# Patient Record
Sex: Female | Born: 1935 | Race: White | Hispanic: No | Smoking: Former smoker
Health system: Southern US, Community
[De-identification: ages and names within clinical notes are randomized; demographics above are authoritative.]

## PROBLEM LIST (undated history)

## (undated) DIAGNOSIS — E7800 Pure hypercholesterolemia, unspecified: Secondary | ICD-10-CM

## (undated) DIAGNOSIS — L570 Actinic keratosis: Secondary | ICD-10-CM

## (undated) DIAGNOSIS — Z860100 Personal history of colon polyps, unspecified: Secondary | ICD-10-CM

## (undated) DIAGNOSIS — M1990 Unspecified osteoarthritis, unspecified site: Secondary | ICD-10-CM

## (undated) DIAGNOSIS — K5903 Drug induced constipation: Secondary | ICD-10-CM

## (undated) HISTORY — DX: Personal history of colonic polyps: Z86.010

## (undated) HISTORY — PX: FRACTURE SURGERY: SHX138

## (undated) HISTORY — DX: Personal history of colon polyps, unspecified: Z86.0100

## (undated) HISTORY — DX: Actinic keratosis: L57.0

---

## 1988-05-29 HISTORY — PX: OTHER SURGICAL HISTORY: SHX169

## 2004-05-04 ENCOUNTER — Ambulatory Visit: Payer: Self-pay | Admitting: Family Medicine

## 2006-05-31 ENCOUNTER — Ambulatory Visit: Payer: Self-pay | Admitting: Gastroenterology

## 2006-08-09 ENCOUNTER — Ambulatory Visit: Payer: Self-pay | Admitting: Gastroenterology

## 2007-10-16 ENCOUNTER — Other Ambulatory Visit: Payer: Self-pay

## 2007-10-16 ENCOUNTER — Ambulatory Visit: Payer: Self-pay | Admitting: Ophthalmology

## 2009-10-09 LAB — HM PAP SMEAR: HM Pap smear: NEGATIVE

## 2010-02-14 ENCOUNTER — Ambulatory Visit: Payer: Self-pay | Admitting: Unknown Physician Specialty

## 2010-08-30 ENCOUNTER — Emergency Department: Payer: Self-pay | Admitting: Emergency Medicine

## 2010-10-12 ENCOUNTER — Ambulatory Visit: Payer: Self-pay | Admitting: Family Medicine

## 2013-03-26 LAB — HM DEXA SCAN

## 2013-05-12 ENCOUNTER — Ambulatory Visit: Payer: Self-pay | Admitting: Unknown Physician Specialty

## 2013-06-17 ENCOUNTER — Ambulatory Visit: Payer: Self-pay | Admitting: Unknown Physician Specialty

## 2013-06-17 LAB — HM COLONOSCOPY

## 2014-04-15 LAB — LIPID PANEL
CHOLESTEROL: 217 mg/dL — AB (ref 0–200)
HDL: 74 mg/dL — AB (ref 35–70)
LDL Cholesterol: 131 mg/dL
Triglycerides: 58 mg/dL (ref 40–160)

## 2014-04-15 LAB — CBC AND DIFFERENTIAL
HEMATOCRIT: 40 % (ref 36–46)
HEMOGLOBIN: 13.3 g/dL (ref 12.0–16.0)
Platelets: 171 10*3/uL (ref 150–399)
WBC: 5.8 10^3/mL

## 2014-05-14 LAB — HM MAMMOGRAPHY: HM MAMMO: NEGATIVE

## 2015-05-04 ENCOUNTER — Ambulatory Visit (INDEPENDENT_AMBULATORY_CARE_PROVIDER_SITE_OTHER): Payer: Medicare Other | Admitting: Family Medicine

## 2015-05-04 ENCOUNTER — Encounter: Payer: Self-pay | Admitting: Family Medicine

## 2015-05-04 VITALS — BP 120/70 | HR 74 | Temp 98.3°F | Resp 16 | Ht 69.18 in | Wt 145.0 lb

## 2015-05-04 DIAGNOSIS — E785 Hyperlipidemia, unspecified: Secondary | ICD-10-CM | POA: Insufficient documentation

## 2015-05-04 DIAGNOSIS — E559 Vitamin D deficiency, unspecified: Secondary | ICD-10-CM

## 2015-05-04 DIAGNOSIS — R5383 Other fatigue: Secondary | ICD-10-CM | POA: Insufficient documentation

## 2015-05-04 DIAGNOSIS — Z23 Encounter for immunization: Secondary | ICD-10-CM

## 2015-05-04 DIAGNOSIS — Z86010 Personal history of colonic polyps: Secondary | ICD-10-CM | POA: Insufficient documentation

## 2015-05-04 DIAGNOSIS — R198 Other specified symptoms and signs involving the digestive system and abdomen: Secondary | ICD-10-CM | POA: Insufficient documentation

## 2015-05-04 DIAGNOSIS — M8580 Other specified disorders of bone density and structure, unspecified site: Secondary | ICD-10-CM | POA: Insufficient documentation

## 2015-05-04 DIAGNOSIS — M1990 Unspecified osteoarthritis, unspecified site: Secondary | ICD-10-CM | POA: Insufficient documentation

## 2015-05-04 DIAGNOSIS — D509 Iron deficiency anemia, unspecified: Secondary | ICD-10-CM | POA: Insufficient documentation

## 2015-05-04 DIAGNOSIS — Z0000 Encounter for general adult medical examination without abnormal findings: Secondary | ICD-10-CM

## 2015-05-04 DIAGNOSIS — K219 Gastro-esophageal reflux disease without esophagitis: Secondary | ICD-10-CM | POA: Insufficient documentation

## 2015-05-04 NOTE — Progress Notes (Signed)
Patient: Amanda Meyer, Female    DOB: Jun 06, 1932, 81 y.o.   MRN: [id number] Visit Date: 05/04/2015  Today's Provider: Lelon Huh, MD   Chief Complaint  Patient presents with  . Annual Exam  . Hyperlipidemia  . Gastroesophageal Reflux   Subjective:    Annual physical  Amanda Meyer is a 81 y.o. female. She feels well. She reports exercising yes. She reports she is sleeping well.  -----------------------------------------------------------     Lipid/Cholesterol, Follow-up:   Last seen for this 04/15/2014  .  On low fat diet. Did not tolerate pravastatin in the past.  . Last Lipid Panel:    Component Value Date/Time   CHOL 217* 04/15/2014   TRIG 58 04/15/2014   HDL 74* 04/15/2014   LDLCALC 131 04/15/2014      She reports good compliance with treatment. (low fat diet)  Weight trend: slightly dropping weight Prior visit with dietician: no Current diet: in general, a "healthy" diet   Current exercise: no regular exercise  Wt Readings from Last 3 Encounters:  05/04/15 145 lb (65.772 kg)  04/15/14 145 lb (65.772 kg)    -------------------------------------------------------------------   Review of Systems  Constitutional: Positive for fatigue.       Occasionally   HENT: Positive for congestion and postnasal drip.        Nasal congestion since June, on and off  Eyes: Negative.   Respiratory: Negative.   Cardiovascular: Negative.   Gastrointestinal: Negative.   Endocrine: Negative.   Genitourinary: Negative.   Musculoskeletal: Positive for back pain and arthralgias.  Skin: Negative.   Allergic/Immunologic: Negative.   Neurological: Negative.   Hematological: Negative.   Psychiatric/Behavioral: Negative.     Social History   Social History  . Marital Status: Widowed    Spouse Name: N/A  . Number of Children: 4  . Years of Education: N/A   Occupational History  . 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 Smoking status: Former Smoker -- 0.75 packs/day for 8 years    Types: Cigarettes    Quit date: 05/29/1960  . Smokeless tobacco: Not on file  . Alcohol Use: No  . Drug Use: No  . Sexual Activity: Not on file   Other Topics Concern  . Not on file   Social History Narrative    Patient Active Problem List   Diagnosis Date Noted  . Altered bowel function 05/04/2015  . GERD (gastroesophageal reflux disease) 05/04/2015  . Fatigue 05/04/2015  . History of colonic polyps 05/04/2015  . HLD (hyperlipidemia) 05/04/2015  . Iron deficiency anemia 05/04/2015  . Osteoarthritis 05/04/2015  . Osteopenia 05/04/2015  . Vitamin D deficiency 05/04/2015    Past Surgical History  Procedure Laterality Date  . Right wrist surgery  1990    broken wrist repair    Her family history includes Bladder Cancer in her brother; Colon cancer in her father. There is no history of Heart disease, Stroke, Diabetes, or Peripheral vascular disease.    Previous Medications   ASPIRIN 81 MG TABLET    Take 1 tablet by mouth daily.   CALCIUM CARBONATE (OSCAL) 1500 (600 CA) MG TABS TABLET    Take 1 tablet by mouth daily.   CHOLECALCIFEROL (VITAMIN D) 1000 UNITS TABLET    Take 1 tablet by mouth daily.   MULTIPLE VITAMINS-MINERALS (MULTIVITAMIN ADULT PO)    Take 1 tablet by mouth daily.   OMEPRAZOLE (  PRILOSEC OTC) 20 MG TABLET    Take 1-2 tablets by mouth daily.   RALOXIFENE (EVISTA) 60 MG TABLET    Take 1 tablet by mouth daily.    Patient Care Team: Birdie Sons, MD as PCP - General (Family Medicine) Manya Silvas, MD (Gastroenterology)     Objective:   Vitals: BP 120/70 mmHg  Pulse 74  Temp(Src) 98.3 F (36.8 C) (Oral)  Resp 16  Ht 5' 9.18" (1.757 m)  Wt 145 lb (65.772 kg)  BMI 21.31 kg/m2  SpO2 97%  Physical Exam   General Appearance:    Alert, cooperative, no distress, appears stated age  Head:    Normocephalic, without obvious abnormality, atraumatic  Eyes:    PERRL,  conjunctiva/corneas clear, EOM's intact, fundi    benign, both eyes  Ears:    Normal TM's and external ear canals, both ears  Nose:   Nares normal, septum midline, mucosa normal, mild clear drainage     Throat:   Lips, mucosa, and tongue normal; teeth and gums normal  Neck:   Supple, symmetrical, trachea midline, no adenopathy;    thyroid:  no enlargement/tenderness/nodules; no carotid   bruit or JVD  Back:     Symmetric, no curvature, ROM normal, no CVA tenderness  Lungs:     Clear to auscultation bilaterally, respirations unlabored  Chest Wall:    No tenderness or deformity   Heart:    Regular rate and rhythm, S1 and S2 normal, no murmur, rub   or gallop  Breast Exam:    deferred  Abdomen:     Soft, non-tender, bowel sounds active all four quadrants,    no masses, no organomegaly  Pelvic:    deferred  Extremities:   Extremities normal, atraumatic, no cyanosis or edema  Pulses:   2+ and symmetric all extremities  Skin:   Skin color, texture, turgor normal, no rashes or lesions  Lymph nodes:   Cervical, supraclavicular, and axillary nodes normal  Neurologic:   CNII-XII intact, normal strength, sensation and reflexes    throughout    Activities of Daily Living In your present state of health, do you have any difficulty performing the following activities: 05/04/2015  Hearing? N  Vision? Y  Difficulty concentrating or making decisions? N  Walking or climbing stairs? N  Dressing or bathing? N  Doing errands, shopping? N    Fall Risk Assessment Fall Risk  05/04/2015  Falls in the past year? No     Depression Screen PHQ 2/9 Scores 05/04/2015  PHQ - 2 Score 0  PHQ- 9 Score 1    Cognitive Testing - 6-CIT  Correct? Score   What year is it? yes 0 0 or 4  What month is it? yes 0 0 or 3  Memorize:    [place], [place],  [birth year],  [place],      What time is it? (within 1 hour) yes 0 0 or 3  Count backwards from 20 yes 0 0, 2, or 4  Name the months of the year yes 0 0, 2, or 4   Repeat name & address above yes 0 0, 2, 4, 6, 8, or 10       TOTAL SCORE  0/28   Interpretation:  Normal  Normal (0-7) Abnormal (8-28)   Audit-C Alcohol Use Screening  Question Answer Points  How often do you have alcoholic drink? never 0  On days you do drink alcohol, how many drinks do you  typically consume? n/a 0  How oftey will you drink 6 or more in a total? never 0  Total Score:  0   A score of 3 or more in women, and 4 or more in men indicates increased risk for alcohol abuse, EXCEPT if all of the points are from question 1.      Assessment & Plan:     Annual Wellness Visit  Reviewed patient's Family Medical History Reviewed and updated list of patient's medical providers Assessment of cognitive impairment was done Assessed patient's functional ability Established a written schedule for health screening [place] Completed and Reviewed  Exercise Activities and Dietary recommendations Goals    None      Immunization History  Administered Date(s) Administered  . Influenza, High Dose Seasonal PF 05/04/2015  . Pneumococcal Conjugate-13 04/15/2014  . Pneumococcal Polysaccharide-23 05/04/2015  . Tdap 10/12/2010  . Zoster 07/28/2008      Discussed health benefits of physical activity, and encouraged her to engage in regular exercise appropriate for her age and condition.    --------------------------------------------------------------------------------  1. Annual physical exam Doing well. Given info for scheduled mammogram at [facility]. Discussed discontinuation of routine mammograms.  - Basic metabolic panel  2. HLD (hyperlipidemia) Intolerant to pravastatin. See how well diet is controlling. Continue 81mg  daily ASA - Lipid panel  3. Iron deficiency anemia  - CBC  4. Vitamin D deficiency  - VITAMIN D 25 Hydroxy (Vit-D Deficiency, Fractures)  5. Need for influenza vaccination  - Flu vaccine HIGH DOSE PF  6. Need for  pneumococcal vaccination

## 2015-05-04 NOTE — Patient Instructions (Signed)
Please call the [facility] ([phone]) to schedule a routine screening mammogram.  

## 2015-05-05 ENCOUNTER — Telehealth: Payer: Self-pay | Admitting: Family Medicine

## 2015-05-05 LAB — LIPID PANEL
CHOLESTEROL TOTAL: 199 mg/dL (ref 100–199)
Chol/HDL Ratio: 2.7 ratio units (ref 0.0–4.4)
HDL: 73 mg/dL (ref >39)
LDL CALC: 110 mg/dL — AB (ref 0–99)
Triglycerides: 79 mg/dL (ref 0–149)
VLDL CHOLESTEROL CAL: 16 mg/dL (ref 5–40)

## 2015-05-05 LAB — CBC
HEMATOCRIT: 37.9 % (ref 34.0–46.6)
Hemoglobin: 12.7 g/dL (ref 11.1–15.9)
MCH: 31 pg (ref 26.6–33.0)
MCHC: 33.5 g/dL (ref 31.5–35.7)
MCV: 92 fL (ref 79–97)
PLATELETS: 189 10*3/uL (ref 150–379)
RBC: 4.1 x10E6/uL (ref 3.77–5.28)
RDW: 13.6 % (ref 12.3–15.4)
WBC: 6 10*3/uL (ref 3.4–10.8)

## 2015-05-05 LAB — VITAMIN D 25 HYDROXY (VIT D DEFICIENCY, FRACTURES): Vit D, 25-Hydroxy: 43.6 ng/mL (ref 30.0–100.0)

## 2015-05-05 LAB — BASIC METABOLIC PANEL
BUN / CREAT RATIO: 17 (ref 11–26)
BUN: 16 mg/dL (ref 8–27)
CO2: 25 mmol/L (ref 18–29)
CREATININE: 0.93 mg/dL (ref 0.57–1.00)
Calcium: 9.2 mg/dL (ref 8.7–10.3)
Chloride: 103 mmol/L (ref 97–106)
GFR calc Af Amer: 67 mL/min/{1.73_m2} (ref >59)
GFR, EST NON AFRICAN AMERICAN: 58 mL/min/{1.73_m2} — AB (ref >59)
GLUCOSE: 92 mg/dL (ref 65–99)
POTASSIUM: 4.6 mmol/L (ref 3.5–5.2)
SODIUM: 143 mmol/L (ref 136–144)

## 2015-05-05 NOTE — Telephone Encounter (Signed)
-----   Message from April Loleta Chance, [place] sent at 05/05/2015  9:33 AM EST ----- Regarding: Labs Patient wanted to know if you had checked her iron levels? I didn't see labs for that in her results. If not pt wanted to know if they could be added. She had recently donated blood and she said they rejected her blood due to her iron levels being low.

## 2015-05-05 NOTE — Telephone Encounter (Signed)
Patient will be out of town today and wanted [place] to call her tomorrow 05/06/2015.

## 2015-05-05 NOTE — Telephone Encounter (Signed)
We checked her red blood cell count and hemoglobin which were normal. If she were iron deficient those would be low.

## 2015-05-06 NOTE — Telephone Encounter (Signed)
Left message to call back  

## 2015-05-07 NOTE — Telephone Encounter (Signed)
Advised patient of results.  

## 2015-05-27 ENCOUNTER — Other Ambulatory Visit: Payer: Self-pay | Admitting: Family Medicine

## 2015-06-22 ENCOUNTER — Ambulatory Visit (INDEPENDENT_AMBULATORY_CARE_PROVIDER_SITE_OTHER): Payer: Medicare Other | Admitting: Family Medicine

## 2015-06-22 ENCOUNTER — Encounter: Payer: Self-pay | Admitting: Family Medicine

## 2015-06-22 VITALS — BP 130/78 | HR 72 | Temp 98.1°F | Resp 16 | Wt 144.0 lb

## 2015-06-22 DIAGNOSIS — D229 Melanocytic nevi, unspecified: Secondary | ICD-10-CM

## 2015-06-22 NOTE — Progress Notes (Signed)
Patient ID: Amanda Meyer, female   DOB: Apr 16, 1933, 82 y.o.   MRN: [id number]       Patient: Amanda Meyer Female    DOB: 1932-07-03   82 y.o.   MRN: [id number] Visit Date: 06/22/2015  Today's Provider: Lelon Huh, MD   Chief Complaint  Patient presents with  . spot on leg    X 2-3 weeks.    Subjective:    HPI Spot on left leg Patient reports that she noticed a brown mole on her lower left leg that seems to be alarming. Patient denies any itching, change in size, or drainage. Patient wants to be sure that it is not cancerous.      Allergies  Allergen Reactions  . Pravastatin Sodium     Fatigue  . Sulfa Antibiotics Rash   Previous Medications   ASPIRIN 81 MG TABLET    Take 1 tablet by mouth daily.   CALCIUM CARBONATE (OSCAL) 1500 (600 CA) MG TABS TABLET    Take 1 tablet by mouth daily.   CHOLECALCIFEROL (VITAMIN D) 1000 UNITS TABLET    Take 1 tablet by mouth daily.   MULTIPLE VITAMINS-MINERALS (MULTIVITAMIN ADULT PO)    Take 1 tablet by mouth daily.   OMEPRAZOLE (PRILOSEC OTC) 20 MG TABLET    Take 1-2 tablets by mouth daily.   OMEPRAZOLE (PRILOSEC) 20 MG CAPSULE    TAKE 1 TO 2 CAPSULES BY MOUTH DAILY (INSURANCE ONLY PAYS FOR 30 CAPSULES A MONTH)   RALOXIFENE (EVISTA) 60 MG TABLET    Take 1 tablet by mouth daily.    Review of Systems  Constitutional: Negative.   Skin: Negative.     Social History  Substance Use Topics  . Smoking status: Former Smoker -- 0.75 packs/day for 8 years    Types: Cigarettes    Quit date: 05/29/1960  . Smokeless tobacco: Not on file  . Alcohol Use: No   Objective:   BP 130/78 mmHg  Pulse 72  Temp(Src) 98.1 F (36.7 C)  Resp 16  Wt 144 lb (65.318 kg)  Physical Exam  General appearance: alert, well developed, well nourished, cooperative and in no distress Head: Normocephalic, without obvious abnormality, atraumatic Lungs: Respirations even and unlabored Extremities: No gross deformities Skin: About 61mm pigmented macule left lower  leg with irregular borders and pigmentation.  Neurologic: Mental status: Alert, oriented to person, place, and time, thought content appropriate.      Assessment & Plan:      1. Nevus  - Ambulatory referral to Dermatology  She states her insurance is requiring her to change to [facility]. She will be due for refills in February and will make reminder to send all new prescriptions the first of February.       Lelon Huh, MD  [facility]

## 2015-06-23 ENCOUNTER — Other Ambulatory Visit: Payer: Self-pay | Admitting: Family Medicine

## 2015-06-28 ENCOUNTER — Telehealth: Payer: Self-pay | Admitting: Family Medicine

## 2015-06-28 MED ORDER — RALOXIFENE HCL 60 MG PO TABS: 60.0000 mg | ORAL_TABLET | Freq: Every day | ORAL | Status: DC

## 2015-06-28 MED ORDER — OMEPRAZOLE 20 MG PO CPDR: 20.0000 mg | DELAYED_RELEASE_CAPSULE | Freq: Every day | ORAL | Status: AC

## 2015-06-28 NOTE — Telephone Encounter (Signed)
-----   Message from Birdie Sons, MD sent at 06/22/2015  4:49 PM EST ----- Regarding: FW: rf all meds [facility] first of february   ----- Message -----    From: Birdie Sons, MD    Sent: 06/22/2015   4:48 PM      To: Birdie Sons, MD Subject: rf all meds [facility] first of feb#

## 2016-04-26 ENCOUNTER — Ambulatory Visit
Admission: RE | Admit: 2016-04-26 | Discharge: 2016-04-26 | Disposition: A | Discharge status: Home / Self Care | Payer: Medicare Other | Source: Ambulatory Visit | Attending: Family Medicine | Admitting: Family Medicine

## 2016-04-26 ENCOUNTER — Encounter: Payer: Self-pay | Admitting: Family Medicine

## 2016-04-26 ENCOUNTER — Ambulatory Visit (INDEPENDENT_AMBULATORY_CARE_PROVIDER_SITE_OTHER): Payer: Medicare Other | Admitting: Family Medicine

## 2016-04-26 VITALS — BP 120/66 | HR 82 | Temp 97.7°F | Resp 16 | Wt 144.0 lb

## 2016-04-26 DIAGNOSIS — M25551 Pain in right hip: Secondary | ICD-10-CM

## 2016-04-26 DIAGNOSIS — I700 Atherosclerosis of aorta: Secondary | ICD-10-CM | POA: Insufficient documentation

## 2016-04-26 DIAGNOSIS — M1611 Unilateral primary osteoarthritis, right hip: Secondary | ICD-10-CM | POA: Insufficient documentation

## 2016-04-26 DIAGNOSIS — M5136 Other intervertebral disc degeneration, lumbar region: Secondary | ICD-10-CM | POA: Insufficient documentation

## 2016-04-26 MED ORDER — METHYLPREDNISOLONE ACETATE 80 MG/ML IJ SUSP
80.0000 mg | Freq: Once | INTRAMUSCULAR | Status: AC
  Administered 2016-04-26: 80 mg via INTRAMUSCULAR

## 2016-04-26 NOTE — Patient Instructions (Signed)
Go to the [facility] on [address] for back and hip Xray 

## 2016-04-26 NOTE — Progress Notes (Signed)
       Patient: Amanda Meyer Female    DOB: 30-Nov-1932   82 y.o.   MRN: [id number] Visit Date: 04/26/2016  Today's Provider: Lelon Huh, MD   Chief Complaint  Patient presents with  . Hip Pain   Subjective:    Hip Pain   Incident onset: chronic; 1 year or more. There was no injury mechanism. The pain is present in the right hip. The quality of the pain is described as burning (occasionally sharp pain that sometimes radiates into right leg). The pain has been worsening since onset. She has tried nothing for the symptoms.  . Pain migrates around right side of hip and leg, but is present every day. Gets worse when she bears weight on right leg. Sometimes keeps her up at night.      Allergies  Allergen Reactions  . Pravastatin Sodium     Fatigue  . Sulfa Antibiotics Rash     Current Outpatient Prescriptions:  .  aspirin 81 MG tablet, Take 1 tablet by mouth daily., Disp: , Rfl:  .  calcium carbonate (OSCAL) 1500 (600 CA) MG TABS tablet, Take 1 tablet by mouth daily., Disp: , Rfl:  .  cholecalciferol (VITAMIN D) 1000 UNITS tablet, Take 1 tablet by mouth daily., Disp: , Rfl:  .  Multiple Vitamins-Minerals (MULTIVITAMIN ADULT PO), Take 1 tablet by mouth daily., Disp: , Rfl:  .  omeprazole (PRILOSEC OTC) 20 MG tablet, Take 1-2 tablets by mouth daily., Disp: , Rfl:  .  omeprazole (PRILOSEC) 20 MG capsule, Take 1 capsule (20 mg total) by mouth daily., Disp: 60 capsule, Rfl: 12 .  raloxifene (EVISTA) 60 MG tablet, Take 1 tablet (60 mg total) by mouth daily., Disp: 30 tablet, Rfl: 12  Review of Systems  Constitutional: Negative for chills and fever.  Musculoskeletal: Positive for gait problem. Negative for back pain, joint swelling, neck pain and neck stiffness.    Social History  Substance Use Topics  . Smoking status: Former Smoker    Packs/day: 0.75    Years: 8.00    Types: Cigarettes    Quit date: 05/29/1960  . Smokeless tobacco: Never Used  . Alcohol use No   Objective:     BP 120/66 (BP Location: Left Arm, Patient Position: Sitting, Cuff Size: Normal)   Pulse 82   Temp 97.7 F (36.5 C) (Oral)   Resp 16   Wt 144 lb (65.3 kg)   SpO2 96%   BMI 21.16 kg/m   Physical Exam  .General appearance: alert, well developed, well nourished, cooperative and in no distress Head: Normocephalic, without obvious abnormality, atraumatic Respiratory: Respirations even and unlabored, normal respiratory rate Extremities: Pain at limits or right hip flexion, internal and external rotation. Tender along lateral aspect of upper thigh and greater trochanter.  Neuro: Normal strength and tone both LEs. .      Assessment & Plan:     1. Right hip pain  - DG HIP UNILAT W OR W/O PELVIS 2-3 VIEWS RIGHT; Future - DG Lumbar Spine Complete; Future - methylPREDNISolone acetate (DEPO-MEDROL) injection 80 mg; Inject 1 mL (80 mg total) into the muscle once.       Lelon Huh, MD  [facility]

## 2016-04-27 ENCOUNTER — Telehealth: Payer: Self-pay

## 2016-04-27 NOTE — Telephone Encounter (Signed)
Patient has been advised. KW 

## 2016-04-27 NOTE — Telephone Encounter (Signed)
-----   Message from Birdie Sons, MD sent at 04/27/2016  8:02 AM EST ----- xrays show some arthritis in spine and hip, but otherwise normal. Pain in her hip is likely from bursitis. Give the cortisone a few days to take effect, if not much better over the weekend that call for referral to orthopedist.

## 2016-06-09 ENCOUNTER — Ambulatory Visit (INDEPENDENT_AMBULATORY_CARE_PROVIDER_SITE_OTHER): Payer: Medicare Other | Admitting: Family Medicine

## 2016-06-09 ENCOUNTER — Encounter: Payer: Self-pay | Admitting: Family Medicine

## 2016-06-09 VITALS — BP 132/78 | HR 71 | Temp 97.7°F | Resp 16 | Wt 140.0 lb

## 2016-06-09 DIAGNOSIS — M25551 Pain in right hip: Secondary | ICD-10-CM

## 2016-06-09 MED ORDER — MELOXICAM 15 MG PO TABS: 15.0000 mg | ORAL_TABLET | Freq: Every day | ORAL | 0 refills | Status: DC

## 2016-06-09 NOTE — Progress Notes (Signed)
Patient: Amanda Meyer Female    DOB: March 19, 1933   83 y.o.   MRN: [id number] Visit Date: 06/09/2016  Today's Provider: Lelon Huh, MD   Chief Complaint  Patient presents with  . Hip Pain   Subjective:    HPI Hip Pain:  Patient was last seen on 04/26/2016 for right hip pain and was given Depo Medrol IM injection. Xray was obtained showing arthritis in spine and hip. Pain was likely from bursitis. Today patient that pain improved somewhat after steroid injection, but never satisfactorily resolved. Pain is located in her right hip and goes down her right leg.   No results found. EXAM: 04/26/2016 LUMBAR SPINE - COMPLETE 4+ VIEW   COMPARISON:  Lumbar spine films of 10/12/2010  FINDINGS: On the frontal view, there is slightly more of a curvature of the lumbar spine convex to the left by 13 degrees than was present previously. There is little change in degenerative disc disease primarily at L2-3 and L3-4 levels where there is loss of disc space and sclerosis with spurring present. No compression deformity is seen. Moderate abdominal aortic atherosclerosis is noted. The SI joints are corticated.  IMPRESSION: 1. Slightly more curvature of the lumbar spine convex to the left measuring 13 degrees. 2. Little change in degenerative disc disease primarily at L2-3 and L3-4 levels. No compression deformity. 3. Moderate abdominal aortic atherosclerosis.   EXAM: 04/26/2016 DG HIP (WITH OR WITHOUT PELVIS) 2-3V RIGHT  COMPARISON:  None.  FINDINGS: For age there is only mild degenerative joint disease the right hip with slightly more loss of joint space on the right than is seen on the left. The pelvic rami are intact. The SI joints appear corticated. There is degenerative change noted at L4-5.  IMPRESSION: Mild degenerative joint disease of the right hip for age. No acute abnormality.    Allergies  Allergen Reactions  . Pravastatin Sodium     Fatigue  . Sulfa  Antibiotics Rash     Current Outpatient Prescriptions:  .  aspirin 81 MG tablet, Take 1 tablet by mouth daily., Disp: , Rfl:  .  calcium carbonate (OSCAL) 1500 (600 CA) MG TABS tablet, Take 1 tablet by mouth daily., Disp: , Rfl:  .  cholecalciferol (VITAMIN D) 1000 UNITS tablet, Take 1 tablet by mouth daily., Disp: , Rfl:  .  MAGNESIUM PO, Take 1 tablet by mouth 2 (two) times daily., Disp: , Rfl:  .  Multiple Vitamins-Minerals (MULTIVITAMIN ADULT PO), Take 1 tablet by mouth daily., Disp: , Rfl:  .  omeprazole (PRILOSEC) 20 MG capsule, Take 1 capsule (20 mg total) by mouth daily., Disp: 60 capsule, Rfl: 12 .  Probiotic Product (PEARLS IC PO), Take 1 tablet by mouth daily., Disp: , Rfl:  .  raloxifene (EVISTA) 60 MG tablet, Take 1 tablet (60 mg total) by mouth daily., Disp: 30 tablet, Rfl: 12  Review of Systems  Constitutional: Negative for appetite change, chills, fatigue and fever.  Respiratory: Negative for chest tightness and shortness of breath.   Cardiovascular: Negative for chest pain and palpitations.  Gastrointestinal: Negative for abdominal pain, nausea and vomiting.  Musculoskeletal: Positive for arthralgias (right hip) and myalgias (right leg).  Neurological: Negative for dizziness and weakness.    Social History  Substance Use Topics  . Smoking status: Former Smoker    Packs/day: 0.75    Years: 8.00    Types: Cigarettes    Quit date: 05/29/1960  . Smokeless tobacco: Never Used  .  Alcohol use No   Objective:   BP 132/78 (BP Location: Left Arm, Patient Position: Sitting, Cuff Size: Normal)   Pulse 71   Temp 97.7 F (36.5 C) (Oral)   Resp 16   Wt 140 lb (63.5 kg)   SpO2 97% Comment: room air  BMI 20.57 kg/m   Physical Exam  .General appearance: alert, well developed, well nourished, cooperative and in no distress Head: Normocephalic, without obvious abnormality, atraumatic Respiratory: Respirations even and unlabored, normal respiratory rate Extremities: Pain at  limits or right hip flexion, internal and external rotation. Tender along lateral aspect of upper thigh and greater trochanter.  Neuro: Normal strength and tone both LEs. .     Assessment & Plan:     1. Right hip pain No significant improvement after steroid injection in November. Discussed heat and ice therapy. Discussed orthopedic referral versus trail of NSAID. Will try meloxicam intermittently and call if not helping after a week.  - meloxicam (MOBIC) 15 MG tablet; Take 1 tablet (15 mg total) by mouth daily.  Dispense: 30 tablet; Refill: 0       Lelon Huh, MD  [facility]

## 2016-06-09 NOTE — Patient Instructions (Signed)
   Call for referral to orthopedist if not doing much better within a week of starting meloxicam

## 2016-06-25 ENCOUNTER — Other Ambulatory Visit: Payer: Self-pay | Admitting: Family Medicine

## 2016-06-26 ENCOUNTER — Encounter: Payer: Self-pay | Admitting: Family Medicine

## 2016-06-26 ENCOUNTER — Ambulatory Visit (INDEPENDENT_AMBULATORY_CARE_PROVIDER_SITE_OTHER): Payer: Medicare Other | Admitting: Family Medicine

## 2016-06-26 VITALS — BP 102/60 | HR 80 | Temp 97.6°F | Resp 16 | Wt 142.0 lb

## 2016-06-26 DIAGNOSIS — M25551 Pain in right hip: Secondary | ICD-10-CM

## 2016-06-26 NOTE — Progress Notes (Signed)
Patient: Amanda Meyer Female    DOB: 1932-06-20   83 y.o.   MRN: [id number] Visit Date: 06/26/2016  Today's Provider: Lelon Huh, MD   Chief Complaint  Patient presents with  . Hip Pain   Subjective:    HPI Hip Pain:  Patient was last seen for right hip pain on 06/09/2016. Patient was was counseled to try ice and heat therapy. Patient was also prescribed Meloxicam. States that pain in leg had resolved after starting meloxicam .  However, state that last week she was walking across aparking lot and felt a sudden severe sharp pain in her right hip. Patient says this sudden sharp pain has now occurred intermitently when ever she steps a certain way. Has had no trouble with balance or falling. Not taken any medications.     Allergies  Allergen Reactions  . Pravastatin Sodium     Fatigue  . Sulfa Antibiotics Rash     Current Outpatient Prescriptions:  .  aspirin 81 MG tablet, Take 1 tablet by mouth daily., Disp: , Rfl:  .  calcium carbonate (OSCAL) 1500 (600 CA) MG TABS tablet, Take 1 tablet by mouth daily., Disp: , Rfl:  .  cholecalciferol (VITAMIN D) 1000 UNITS tablet, Take 1 tablet by mouth daily., Disp: , Rfl:  .  MAGNESIUM PO, Take 1 tablet by mouth 2 (two) times daily., Disp: , Rfl:  .  meloxicam (MOBIC) 15 MG tablet, Take 1 tablet (15 mg total) by mouth daily., Disp: 30 tablet, Rfl: 0 .  Multiple Vitamins-Minerals (MULTIVITAMIN ADULT PO), Take 1 tablet by mouth daily., Disp: , Rfl:  .  omeprazole (PRILOSEC) 20 MG capsule, Take 1 capsule (20 mg total) by mouth daily., Disp: 60 capsule, Rfl: 12 .  Probiotic Product (PEARLS IC PO), Take 1 tablet by mouth daily., Disp: , Rfl:  .  raloxifene (EVISTA) 60 MG tablet, TAKE ONE TABLET EVERY DAY, Disp: 30 tablet, Rfl: 5  Review of Systems  Constitutional: Negative for appetite change, chills, fatigue and fever.  Respiratory: Negative for chest tightness and shortness of breath.   Cardiovascular: Negative for chest pain and  palpitations.  Gastrointestinal: Negative for abdominal pain, nausea and vomiting.  Musculoskeletal: Positive for arthralgias (right hip and leg).  Neurological: Negative for dizziness and weakness.    Social History  Substance Use Topics  . Smoking status: Former Smoker    Packs/day: 0.75    Years: 8.00    Types: Cigarettes    Quit date: 05/29/1960  . Smokeless tobacco: Never Used  . Alcohol use No   Objective:   BP 102/60 (BP Location: Left Arm, Patient Position: Sitting, Cuff Size: Normal)   Pulse 80   Temp 97.6 F (36.4 C) (Oral)   Resp 16   Wt 142 lb (64.4 kg)   BMI 20.86 kg/m   Physical Exam   General Appearance:    Alert, cooperative, no distress  Eyes:    PERRL, conjunctiva/corneas clear, EOM's intact       Lungs:     Clear to auscultation bilaterally, respirations unlabored  Heart:    Regular rate and rhythm  Neurologic:   Awake, alert, oriented x 3. No apparent focal neurological           defect.   MS:  No tenderness of hip. Slight pain with external rotation. Normal gait and balance. No gross deformities.        Assessment & Plan:     1. Right hip pain  New pain sudden onset while walking.  - DG HIP UNILAT W OR W/O PELVIS 2-3 VIEWS RIGHT; Future  The entirety of the information documented in the History of Present Illness, Review of Systems and Physical Exam were personally obtained by me. Portions of this information were initially documented by Meyer Cory, CMA and reviewed by me for thoroughness and accuracy.        Lelon Huh, MD  [facility]

## 2016-06-26 NOTE — Patient Instructions (Signed)
Go to the [facility] on [address] for right hip Xray  

## 2016-06-27 ENCOUNTER — Ambulatory Visit
Admission: RE | Admit: 2016-06-27 | Discharge: 2016-06-27 | Disposition: A | Discharge status: Home / Self Care | Payer: Medicare Other | Source: Ambulatory Visit | Attending: Family Medicine | Admitting: Family Medicine

## 2016-06-27 DIAGNOSIS — M25551 Pain in right hip: Secondary | ICD-10-CM | POA: Insufficient documentation

## 2016-07-14 ENCOUNTER — Other Ambulatory Visit: Payer: Self-pay

## 2016-07-14 DIAGNOSIS — M25551 Pain in right hip: Secondary | ICD-10-CM

## 2016-07-14 NOTE — Telephone Encounter (Signed)
Patient came into office requesting a refill on her Meloxicam and to request referral to Orthopedic. Patient states that she has had hip pain greater than 3 months and has seen you in office recently for this problem. Patient states that pain in her hip can be a burning sharp pain at time and when walking she says it feels like her leg gives out at times do to the shooting pain. Patient says she will be leaving for [place] next week and was hoping to have medication since she plans on doing a lot of walking. Please review chart and advise. KW

## 2016-07-15 MED ORDER — MELOXICAM 15 MG PO TABS: 15.0000 mg | ORAL_TABLET | Freq: Every day | ORAL | 5 refills | Status: DC

## 2016-07-17 NOTE — Telephone Encounter (Signed)
Spoke with patient and advised her that prescription was sent to pharmacy. KW

## 2016-07-26 ENCOUNTER — Telehealth: Payer: Self-pay | Admitting: Family Medicine

## 2016-07-26 MED ORDER — RALOXIFENE HCL 60 MG PO TABS: 60.0000 mg | ORAL_TABLET | Freq: Every day | ORAL | 5 refills | Status: DC

## 2016-07-26 NOTE — Telephone Encounter (Signed)
Pt needs refill on   raloxifene (EVISTA) 60 MG tablet  [facility]  thanksTeri

## 2016-07-26 NOTE — Telephone Encounter (Signed)
Please review

## 2016-08-04 ENCOUNTER — Telehealth: Payer: Self-pay

## 2016-08-04 DIAGNOSIS — M25551 Pain in right hip: Secondary | ICD-10-CM

## 2016-08-04 NOTE — Telephone Encounter (Signed)
Patient called and states that she wants to proceed with orthopedic referral. Patient states it is for her right hip pain and it is not better at all-aa

## 2016-08-11 ENCOUNTER — Encounter: Payer: Self-pay | Admitting: Family Medicine

## 2016-08-11 ENCOUNTER — Ambulatory Visit (INDEPENDENT_AMBULATORY_CARE_PROVIDER_SITE_OTHER): Payer: Medicare Other | Admitting: Family Medicine

## 2016-08-11 VITALS — BP 118/72 | Temp 97.9°F | Resp 16 | Ht 69.0 in | Wt 138.0 lb

## 2016-08-11 DIAGNOSIS — Z1231 Encounter for screening mammogram for malignant neoplasm of breast: Secondary | ICD-10-CM | POA: Diagnosis not present

## 2016-08-11 DIAGNOSIS — E785 Hyperlipidemia, unspecified: Secondary | ICD-10-CM | POA: Diagnosis not present

## 2016-08-11 DIAGNOSIS — R5383 Other fatigue: Secondary | ICD-10-CM

## 2016-08-11 DIAGNOSIS — M8580 Other specified disorders of bone density and structure, unspecified site: Secondary | ICD-10-CM | POA: Diagnosis not present

## 2016-08-11 DIAGNOSIS — Z1239 Encounter for other screening for malignant neoplasm of breast: Secondary | ICD-10-CM

## 2016-08-11 DIAGNOSIS — Z0000 Encounter for general adult medical examination without abnormal findings: Secondary | ICD-10-CM | POA: Diagnosis not present

## 2016-08-11 DIAGNOSIS — E2839 Other primary ovarian failure: Secondary | ICD-10-CM

## 2016-08-11 DIAGNOSIS — L301 Dyshidrosis [pompholyx]: Secondary | ICD-10-CM

## 2016-08-11 DIAGNOSIS — E559 Vitamin D deficiency, unspecified: Secondary | ICD-10-CM | POA: Diagnosis not present

## 2016-08-11 MED ORDER — TRIAMCINOLONE ACETONIDE 0.5 % EX CREA: 1.0000 "application " | TOPICAL_CREAM | Freq: Two times a day (BID) | CUTANEOUS | 1 refills | Status: DC

## 2016-08-11 NOTE — Progress Notes (Signed)
Patient: Amanda Meyer, Female    DOB: [birth year], 83 y.o.   MRN: [id number] Visit Date: 08/11/2016  Today's Provider: Lelon Huh, MD   Chief Complaint  Patient presents with  . Annual Wellness Exam   Subjective:    Annual wellness visit Amanda Meyer is a 83 y.o. female. She feels well. She reports exercising occasionally. She stays active. She reports she is sleeping fairly well.    Lipid/Cholesterol, Follow-up:   Last seen for this1 years ago.  Management changes since that visit include no changes. Continue a low fat diet.  . Last Lipid Panel:    Component Value Date/Time   CHOL 199 05/04/2015 1000   TRIG 79 05/04/2015 1000   HDL 73 05/04/2015 1000   CHOLHDL 2.7 05/04/2015 1000   LDLCALC 110 (H) 05/04/2015 1000    She reports good compliance with treatment. She is not having side effects.  Current symptoms include none and have been stable. Weight trend: stable Prior visit with dietician: no Current diet: well balanced Current exercise: yard work  [employer] from Last 3 Encounters:  08/11/16 138 lb (62.6 kg)  06/26/16 142 lb (64.4 kg)  06/09/16 140 lb (63.5 kg)    Is scheduled to see Dr. Sabra Heck about hip next week. Has felt somewhat fatigued the last few months. She thinks it may be related to dealing with persistent hip pain.   Continues on vitamin D supplements, is due for follow up BMD with osteopenia findings on last test in 2015. He continue to avoid saturated fats in diet since she doesn't tolerate statins.   Review of Systems  Constitutional: Negative.   HENT: Negative.   Eyes: Negative.   Respiratory: Negative.   Cardiovascular: Negative.   Gastrointestinal: Negative.   Endocrine: Negative.   Genitourinary: Negative.   Musculoskeletal: Positive for arthralgias.  Skin: Negative.   Allergic/Immunologic: Negative.   Neurological: Negative.   Hematological: Negative.   Psychiatric/Behavioral: Negative.     Social History    Social History  . Marital status: Widowed    Spouse name: N/A  . Number of children: 4  . Years of education: N/A   Occupational History  . 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 Smoking status: Former Smoker    Packs/day: 0.75    Years: 8.00    Types: Cigarettes    Quit date: 05/29/1960  . Smokeless tobacco: Never Used  . Alcohol use No  . Drug use: No  . Sexual activity: Not on file   Other Topics Concern  . Not on file   Social History Narrative  . No narrative on file    Past Medical History:  Diagnosis Date  . History of colon polyps      Patient Active Problem List   Diagnosis Date Noted  . Altered bowel function 05/04/2015  . GERD (gastroesophageal reflux disease) 05/04/2015  . Fatigue 05/04/2015  . History of colonic polyps 05/04/2015  . HLD (hyperlipidemia) 05/04/2015  . Iron deficiency anemia 05/04/2015  . Osteoarthritis 05/04/2015  . Osteopenia 05/04/2015  . Vitamin D deficiency 05/04/2015    Past Surgical History:  Procedure Laterality Date  . right wrist surgery  1990   broken wrist repair    Her family history includes Bladder Cancer in her brother; Colon cancer in her father.      Current Outpatient Prescriptions:  .  aspirin 81 MG tablet, Take 1  tablet by mouth daily., Disp: , Rfl:  .  calcium carbonate (OSCAL) 1500 (600 CA) MG TABS tablet, Take 1 tablet by mouth daily., Disp: , Rfl:  .  cholecalciferol (VITAMIN D) 1000 UNITS tablet, Take 1 tablet by mouth daily., Disp: , Rfl:  .  meloxicam (MOBIC) 15 MG tablet, Take 1 tablet (15 mg total) by mouth daily., Disp: 30 tablet, Rfl: 5 .  Multiple Vitamins-Minerals (MULTIVITAMIN ADULT PO), Take 1 tablet by mouth daily., Disp: , Rfl:  .  omeprazole (PRILOSEC) 20 MG capsule, Take 1 capsule (20 mg total) by mouth daily., Disp: 60 capsule, Rfl: 12 .  raloxifene (EVISTA) 60 MG tablet, Take 1 tablet (60 mg total) by mouth daily., Disp: 30 tablet, Rfl: 5 .   MAGNESIUM PO, Take 1 tablet by mouth 2 (two) times daily., Disp: , Rfl:  .  Probiotic Product (PEARLS IC PO), Take 1 tablet by mouth daily., Disp: , Rfl:   Patient Care Team: Birdie Sons, MD as PCP - General (Family Medicine) Manya Silvas, MD (Gastroenterology)     Objective:   Vitals: BP 118/72 (BP Location: Right Arm, Patient Position: Sitting, Cuff Size: Normal)   Temp 97.9 F (36.6 C)   Resp 16   Ht 5\' 9"  (1.753 m)   Wt 138 lb (62.6 kg)   BMI 20.38 kg/m   Physical Exam   General Appearance:    Alert, cooperative, no distress  Eyes:    PERRL, conjunctiva/corneas clear, EOM's intact       Lungs:     Clear to auscultation bilaterally, respirations unlabored  Heart:    Regular rate and rhythm  Neurologic:   Awake, alert, oriented x 3. No apparent focal neurological           defect.   Skin:   Dyshidrotic eczema tips of fingers of both hands.     Activities of Daily Living In your present state of health, do you have any difficulty performing the following activities: 08/11/2016  Hearing? N  Vision? N  Difficulty concentrating or making decisions? N  Walking or climbing stairs? N  Dressing or bathing? N  Doing errands, shopping? N  Some recent data might be hidden    Fall Risk Assessment Fall Risk  08/11/2016 05/04/2015  Falls in the past year? No No     Depression Screen PHQ 2/9 Scores 08/11/2016 05/04/2015  PHQ - 2 Score 0 0  PHQ- 9 Score - 1    Cognitive Testing - 6-CIT  Correct? Score   What year is it? yes 0 0 or 4  What month is it? yes 0 0 or 3  Memorize:    [place], [place],  [birth year],  [place],      What time is it? (within 1 hour) yes 0 0 or 3  Count backwards from 20 yes 0 0, 2, or 4  Name the months of the year yes 0 0, 2, or 4  Repeat name & address above yes 0 0, 2, 4, 6, 8, or 10       TOTAL SCORE  0/28   Interpretation:  Normal  Normal (0-7) Abnormal (8-28)       Assessment & Plan:     Annual Wellness Visit  Reviewed  patient's Family Medical History Reviewed and updated list of patient's medical providers Assessment of cognitive impairment was done Assessed patient's functional ability Established a written schedule for health screening [place] Completed and Reviewed  Exercise Activities and Dietary recommendations Goals    None      Immunization History  Administered Date(s) Administered  . Influenza, High Dose Seasonal PF 05/04/2015  . Pneumococcal Conjugate-13 04/15/2014  . Pneumococcal Polysaccharide-23 05/04/2015  . Tdap 10/12/2010  . Zoster 07/28/2008    Health Maintenance  Topic Date Due  . INFLUENZA VACCINE  12/28/2015  . DEXA SCAN  03/26/2016  . TETANUS/TDAP  10/11/2020  . PNA vac Low Risk Adult  Completed     Discussed health benefits of physical activity, and encouraged her to engage in regular exercise appropriate for her age and condition.    1. Medicare annual wellness visit, subsequent   2. Fatigue, unspecified type  - Comprehensive metabolic panel - CBC - T4 AND TSH  3. Dyshidrotic eczema Triamcinolone 0.5% cream  4. Breast cancer screening  - MM SCREENING BREAST TOMO BILATERAL; Future  5. Estrogen deficiency  - DG Bone Density; Future  6. Osteopenia, unspecified location   7. Vitamin D deficiency  - VITAMIN D 25 Hydroxy (Vit-D Deficiency, Fractures)  8. Hyperlipidemia, unspecified hyperlipidemia type Diet controlled.  - Lipid panel  The entirety of the information documented in the History of Present Illness, Review of Systems and Physical Exam were personally obtained by me. Portions of this information were initially documented by Wilburt Finlay, CMA and reviewed by me for thoroughness and accuracy.    Lelon Huh, MD  [facility]

## 2016-08-11 NOTE — Patient Instructions (Signed)
Your mammogram is schedule April 4th at 9:30am at [facility]

## 2016-08-12 LAB — LIPID PANEL
CHOL/HDL RATIO: 2.8 ratio (ref 0.0–4.4)
Cholesterol, Total: 199 mg/dL (ref 100–199)
HDL: 71 mg/dL (ref >39)
LDL Calculated: 113 mg/dL — ABNORMAL HIGH (ref 0–99)
TRIGLYCERIDES: 73 mg/dL (ref 0–149)
VLDL Cholesterol Cal: 15 mg/dL (ref 5–40)

## 2016-08-12 LAB — CBC
HEMATOCRIT: 38.9 % (ref 34.0–46.6)
Hemoglobin: 12.6 g/dL (ref 11.1–15.9)
MCH: 29.9 pg (ref 26.6–33.0)
MCHC: 32.4 g/dL (ref 31.5–35.7)
MCV: 92 fL (ref 79–97)
Platelets: 231 10*3/uL (ref 150–379)
RBC: 4.22 x10E6/uL (ref 3.77–5.28)
RDW: 14.3 % (ref 12.3–15.4)
WBC: 6 10*3/uL (ref 3.4–10.8)

## 2016-08-12 LAB — COMPREHENSIVE METABOLIC PANEL
ALT: 16 IU/L (ref 0–32)
AST: 22 IU/L (ref 0–40)
Albumin/Globulin Ratio: 1.5 (ref 1.2–2.2)
Albumin: 4.1 g/dL (ref 3.5–4.7)
Alkaline Phosphatase: 58 IU/L (ref 39–117)
BILIRUBIN TOTAL: 0.4 mg/dL (ref 0.0–1.2)
BUN / CREAT RATIO: 20 (ref 12–28)
BUN: 21 mg/dL (ref 8–27)
CALCIUM: 9.2 mg/dL (ref 8.7–10.3)
CHLORIDE: 101 mmol/L (ref 96–106)
CO2: 25 mmol/L (ref 18–29)
Creatinine, Ser: 1.06 mg/dL — ABNORMAL HIGH (ref 0.57–1.00)
GFR calc non Af Amer: 49 mL/min/{1.73_m2} — ABNORMAL LOW (ref >59)
GFR, EST AFRICAN AMERICAN: 57 mL/min/{1.73_m2} — AB (ref >59)
GLOBULIN, TOTAL: 2.8 g/dL (ref 1.5–4.5)
Glucose: 98 mg/dL (ref 65–99)
Potassium: 5.1 mmol/L (ref 3.5–5.2)
Sodium: 141 mmol/L (ref 134–144)
Total Protein: 6.9 g/dL (ref 6.0–8.5)

## 2016-08-12 LAB — T4 AND TSH
T4, Total: 7.6 ug/dL (ref 4.5–12.0)
TSH: 2.43 u[IU]/mL (ref 0.450–4.500)

## 2016-08-12 LAB — VITAMIN D 25 HYDROXY (VIT D DEFICIENCY, FRACTURES): VIT D 25 HYDROXY: 50 ng/mL (ref 30.0–100.0)

## 2016-08-14 ENCOUNTER — Telehealth: Payer: Self-pay

## 2016-08-14 NOTE — Telephone Encounter (Signed)
Patient advised as directed below. Patient verbalized understanding.  Thanks,  -Joseline

## 2016-08-14 NOTE — Telephone Encounter (Signed)
-----   Message from Birdie Sons, MD sent at 08/14/2016  1:09 PM EDT ----- Labs are all very good. Continue current medications.  Check labs yearly.

## 2016-08-30 LAB — HM MAMMOGRAPHY

## 2016-08-30 LAB — HM DEXA SCAN

## 2016-09-06 ENCOUNTER — Encounter: Payer: Self-pay | Admitting: Family Medicine

## 2016-11-27 ENCOUNTER — Ambulatory Visit
Admission: RE | Admit: 2016-11-27 | Discharge: 2016-11-27 | Disposition: A | Discharge status: Home / Self Care | Payer: Medicare Other | Source: Ambulatory Visit | Attending: Family Medicine | Admitting: Family Medicine

## 2016-11-27 ENCOUNTER — Encounter: Payer: Self-pay | Admitting: Family Medicine

## 2016-11-27 ENCOUNTER — Ambulatory Visit (INDEPENDENT_AMBULATORY_CARE_PROVIDER_SITE_OTHER): Payer: Medicare Other | Admitting: Family Medicine

## 2016-11-27 VITALS — BP 134/70 | HR 88 | Temp 97.7°F | Resp 16 | Wt 136.0 lb

## 2016-11-27 DIAGNOSIS — M1611 Unilateral primary osteoarthritis, right hip: Secondary | ICD-10-CM | POA: Diagnosis not present

## 2016-11-27 DIAGNOSIS — M25551 Pain in right hip: Secondary | ICD-10-CM

## 2016-11-27 NOTE — Progress Notes (Signed)
Patient: Amanda Meyer Female    DOB: 11-Dec-1932   83 y.o.   MRN: [id number] Visit Date: 11/27/2016  Today's Provider: Lelon Huh, MD   Chief Complaint  Patient presents with  . Hip Pain  . Constipation   Subjective:    HPI Pt is here today for right hip pain. She reports that pain starts in her tail bone over and occasionally radiates down right leg She states it is worse if she gets up to do daily household activities for as little as 20-30 minutes.  Denies any numbness or tingling down her leg. She says that she can not get comfortable in the bed at night. She says when she lifts her rear end up with her hand on the right side it makes the pain subside. Saw Dr. Sabra Heck in March for bursitis of hip which resolved with steroid injection.   Pt also concerned about her weight loss over the past year she had gradually lost 20 pounds and has not been trying however she does admit to not eating as much as she used to but says that she does not feel she should have lost 20 pounds that way. She reports that she stays constipated and this has been occurring over the past year as well. She has tried multiple things that are OTC. She reports that she has a BM daily but it is very small amounts and is as if it is "rabbit pellets". Wt Readings from Last 3 Encounters:  11/27/16 136 lb (61.7 kg)  08/11/16 138 lb (62.6 kg)  06/22/2015 144 lb    DG Right Hip 06/27/2016 1. No fracture, dislocation or acute finding. 2. Concentric hip joint space narrowing on the right. This is similar to the prior exam. There is a levoscoliosis of the lumbar spine. The asymmetric joint space narrowing may be due to altered weight-bearing.    Allergies  Allergen Reactions  . Pravastatin Sodium     Fatigue  . Sulfa Antibiotics Rash     Current Outpatient Prescriptions:  .  aspirin 81 MG tablet, Take 1 tablet by mouth daily., Disp: , Rfl:  .  calcium carbonate (OSCAL) 1500 (600 CA) MG TABS tablet, Take 1  tablet by mouth daily., Disp: , Rfl:  .  cholecalciferol (VITAMIN D) 1000 UNITS tablet, Take 1 tablet by mouth daily., Disp: , Rfl:  .  MAGNESIUM PO, Take 1 tablet by mouth 2 (two) times daily., Disp: , Rfl:  .  Multiple Vitamins-Minerals (MULTIVITAMIN ADULT PO), Take 1 tablet by mouth daily., Disp: , Rfl:  .  raloxifene (EVISTA) 60 MG tablet, Take 1 tablet (60 mg total) by mouth daily., Disp: 30 tablet, Rfl: 5 .  meloxicam (MOBIC) 15 MG tablet, Take 1 tablet (15 mg total) by mouth daily. (Patient not taking: Reported on 11/27/2016), Disp: 30 tablet, Rfl: 5 .  omeprazole (PRILOSEC) 20 MG capsule, Take 1 capsule (20 mg total) by mouth daily. (Patient not taking: Reported on 11/27/2016), Disp: 60 capsule, Rfl: 12 .  Probiotic Product (PEARLS IC PO), Take 1 tablet by mouth daily., Disp: , Rfl:  .  triamcinolone cream (KENALOG) 0.5 %, Apply 1 application topically 2 (two) times daily. To affected area of fingers (Patient not taking: Reported on 11/27/2016), Disp: 15 g, Rfl: 1  Review of Systems  Constitutional: Negative.   HENT: Negative.   Eyes: Negative.   Respiratory: Negative.   Cardiovascular: Negative.   Gastrointestinal: Positive for constipation.  Endocrine: Negative.  Genitourinary: Negative.   Musculoskeletal: Positive for back pain.  Allergic/Immunologic: Negative.   Neurological: Negative.   Hematological: Negative.   Psychiatric/Behavioral: Negative.     Social History  Substance Use Topics  . Smoking status: Former Smoker    Packs/day: 0.75    Years: 8.00    Types: Cigarettes    Quit date: 05/29/1960  . Smokeless tobacco: Never Used  . Alcohol use No   Objective:   BP 134/70 (BP Location: Right Arm, Patient Position: Sitting, Cuff Size: Normal)   Pulse 88   Temp 97.7 F (36.5 C) (Oral)   Resp 16   Wt 136 lb (61.7 kg)   SpO2 97%   BMI 20.08 kg/m  Vitals:   11/27/16 1544  BP: 134/70  Pulse: 88  Resp: 16  Temp: 97.7 F (36.5 C)  TempSrc: Oral  SpO2: 97%  Weight:  136 lb (61.7 kg)     Physical Exam   General Appearance:    Alert, cooperative, no distress  Eyes:    PERRL, conjunctiva/corneas clear, EOM's intact       Lungs:     Clear to auscultation bilaterally, respirations unlabored  Heart:    Regular rate and rhythm  Neurologic:   Awake, alert, oriented x 3. No apparent focal neurological           defect.   MS:   Tender over right ischium. No gross deformities.        Assessment & Plan:     1. Ischial pain, right  - DG HIP UNILAT W OR W/O PELVIS 2-3 VIEWS RIGHT; Future        Lelon Huh, MD  [facility]

## 2016-12-26 ENCOUNTER — Other Ambulatory Visit: Payer: Self-pay | Admitting: Family Medicine

## 2017-02-17 ENCOUNTER — Other Ambulatory Visit: Payer: Self-pay | Admitting: Family Medicine

## 2017-02-17 DIAGNOSIS — M25551 Pain in right hip: Secondary | ICD-10-CM

## 2017-07-05 ENCOUNTER — Encounter: Payer: Self-pay | Admitting: Emergency Medicine

## 2017-07-05 ENCOUNTER — Emergency Department
Admission: EM | Admit: 2017-07-05 | Discharge: 2017-07-05 | Disposition: A | Discharge status: Home / Self Care | Payer: Medicare Other | Attending: Emergency Medicine | Admitting: Emergency Medicine

## 2017-07-05 DIAGNOSIS — R112 Nausea with vomiting, unspecified: Secondary | ICD-10-CM | POA: Diagnosis present

## 2017-07-05 DIAGNOSIS — Z882 Allergy status to sulfonamides status: Secondary | ICD-10-CM | POA: Diagnosis not present

## 2017-07-05 DIAGNOSIS — R1013 Epigastric pain: Secondary | ICD-10-CM | POA: Diagnosis not present

## 2017-07-05 DIAGNOSIS — Z87891 Personal history of nicotine dependence: Secondary | ICD-10-CM | POA: Diagnosis not present

## 2017-07-05 DIAGNOSIS — R109 Unspecified abdominal pain: Secondary | ICD-10-CM | POA: Insufficient documentation

## 2017-07-05 DIAGNOSIS — Z79899 Other long term (current) drug therapy: Secondary | ICD-10-CM | POA: Insufficient documentation

## 2017-07-05 DIAGNOSIS — B349 Viral infection, unspecified: Secondary | ICD-10-CM | POA: Insufficient documentation

## 2017-07-05 DIAGNOSIS — Z7982 Long term (current) use of aspirin: Secondary | ICD-10-CM | POA: Diagnosis not present

## 2017-07-05 LAB — URINALYSIS, COMPLETE (UACMP) WITH MICROSCOPIC
Bacteria, UA: NONE SEEN
Bilirubin Urine: NEGATIVE
GLUCOSE, UA: NEGATIVE mg/dL
HGB URINE DIPSTICK: NEGATIVE
Ketones, ur: 20 mg/dL — AB
Leukocytes, UA: NEGATIVE
Nitrite: NEGATIVE
PH: 8 (ref 5.0–8.0)
Protein, ur: 30 mg/dL — AB
RBC / HPF: NONE SEEN RBC/hpf (ref 0–5)
SPECIFIC GRAVITY, URINE: 1.019 (ref 1.005–1.030)
Squamous Epithelial / LPF: NONE SEEN
WBC, UA: NONE SEEN WBC/hpf (ref 0–5)

## 2017-07-05 LAB — CBC
HCT: 34 % — ABNORMAL LOW (ref 35.0–47.0)
Hemoglobin: 11.2 g/dL — ABNORMAL LOW (ref 12.0–16.0)
MCH: 27.2 pg (ref 26.0–34.0)
MCHC: 32.9 g/dL (ref 32.0–36.0)
MCV: 82.7 fL (ref 80.0–100.0)
PLATELETS: 257 10*3/uL (ref 150–440)
RBC: 4.11 MIL/uL (ref 3.80–5.20)
RDW: 15.7 % — AB (ref 11.5–14.5)
WBC: 6.5 10*3/uL (ref 3.6–11.0)

## 2017-07-05 LAB — COMPREHENSIVE METABOLIC PANEL
ALBUMIN: 3.6 g/dL (ref 3.5–5.0)
ALK PHOS: 57 U/L (ref 38–126)
ALT: 15 U/L (ref 14–54)
AST: 27 U/L (ref 15–41)
Anion gap: 11 (ref 5–15)
BILIRUBIN TOTAL: 0.5 mg/dL (ref 0.3–1.2)
BUN: 25 mg/dL — ABNORMAL HIGH (ref 6–20)
CALCIUM: 9 mg/dL (ref 8.9–10.3)
CO2: 22 mmol/L (ref 22–32)
CREATININE: 1.04 mg/dL — AB (ref 0.44–1.00)
Chloride: 105 mmol/L (ref 101–111)
GFR calc non Af Amer: 49 mL/min — ABNORMAL LOW (ref >60)
GFR, EST AFRICAN AMERICAN: 56 mL/min — AB (ref >60)
GLUCOSE: 128 mg/dL — AB (ref 65–99)
Potassium: 3.9 mmol/L (ref 3.5–5.1)
SODIUM: 138 mmol/L (ref 135–145)
Total Protein: 7.1 g/dL (ref 6.5–8.1)

## 2017-07-05 LAB — TROPONIN I

## 2017-07-05 LAB — LIPASE, BLOOD: Lipase: 36 U/L (ref 11–51)

## 2017-07-05 MED ORDER — ONDANSETRON HCL 4 MG/2ML IJ SOLN
4.0000 mg | Freq: Once | INTRAMUSCULAR | Status: AC
  Administered 2017-07-05: 4 mg via INTRAVENOUS
  Filled 2017-07-05: qty 2

## 2017-07-05 MED ORDER — GI COCKTAIL ~~LOC~~: 30.0000 mL | Freq: Once | ORAL | Status: DC

## 2017-07-05 MED ORDER — ONDANSETRON 4 MG PO TBDP: 4.0000 mg | ORAL_TABLET | Freq: Three times a day (TID) | ORAL | 0 refills | Status: DC | PRN

## 2017-07-05 MED ORDER — ACETAMINOPHEN 500 MG PO TABS
1000.0000 mg | ORAL_TABLET | Freq: Once | ORAL | Status: AC
  Administered 2017-07-05: 1000 mg via ORAL
  Filled 2017-07-05: qty 2

## 2017-07-05 MED ORDER — SODIUM CHLORIDE 0.9 % IV BOLUS (SEPSIS)
1000.0000 mL | Freq: Once | INTRAVENOUS | Status: AC
  Administered 2017-07-05: 1000 mL via INTRAVENOUS

## 2017-07-05 MED ORDER — ALUM & MAG HYDROXIDE-SIMETH 200-200-20 MG/5ML PO SUSP
30.0000 mL | Freq: Once | ORAL | Status: AC
  Administered 2017-07-05: 30 mL via ORAL
  Filled 2017-07-05: qty 30

## 2017-07-05 MED ORDER — ONDANSETRON 4 MG PO TBDP
4.0000 mg | ORAL_TABLET | Freq: Once | ORAL | Status: AC | PRN
  Administered 2017-07-05: 4 mg via ORAL
  Filled 2017-07-05: qty 1

## 2017-07-05 NOTE — ED Notes (Signed)
Informed RN that patient has been roomed and is ready for evaluation.  Patient in NAD at this time and call bell placed within reach.   

## 2017-07-05 NOTE — Discharge Instructions (Signed)

## 2017-07-05 NOTE — ED Notes (Signed)
Pt alert, oriented. States today around lunch she vomited. Vomited when she came to ED. States upper belly pain that feels burning. States lower belly pain that is intermittent and sharp. Still has gallbladder and appendix. No distress noted. Denies feeling nauseous at this time.

## 2017-07-05 NOTE — ED Provider Notes (Signed)
[facility] Emergency Department Provider Note  ____________________________________________  Time seen: Approximately 7:34 PM  I have reviewed the triage vital signs and the nursing notes.   HISTORY  Chief Complaint Emesis   HPI Amanda Meyer is a 84 y.o. female with a history of osteopenia and osteoarthritis who presents for evaluation of nausea and vomiting. 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 Today after coming home from work she reports that she started feeling unwell. She developed severe nausea. She has had 2 episodes of nonbloody nonbilious emesis. She is complaining of intermittent sharp severe cramping abdominal pain and also intermittent moderate burning sensation in her epigastric region that has been constant for several hours. She denies chest pain or history of cardiac disease. She is not a smoker. No diarrhea. Last BM was this morning. No dysuria or hematuria. No fever or chills. No URI symptoms.  Past Medical History:  Diagnosis Date  . History of colon polyps     Patient Active Problem List   Diagnosis Date Noted  . Altered bowel function 05/04/2015  . GERD (gastroesophageal reflux disease) 05/04/2015  . Fatigue 05/04/2015  . History of colonic polyps 05/04/2015  . HLD (hyperlipidemia) 05/04/2015  . Iron deficiency anemia 05/04/2015  . Osteoarthritis 05/04/2015  . Osteopenia 05/04/2015  . Vitamin D deficiency 05/04/2015    Past Surgical History:  Procedure Laterality Date  . right wrist surgery  1990   broken wrist repair    Prior to Admission medications   Medication Sig Start Date End Date Taking? Authorizing Provider  aspirin 81 MG tablet Take 1 tablet by mouth daily.    [provider]  calcium carbonate (OSCAL) 1500 (600 CA) MG TABS tablet Take 1 tablet by mouth daily.    [provider]  cholecalciferol (VITAMIN D) 1000 UNITS tablet Take 1 tablet by mouth daily. 10/17/11    [provider]  MAGNESIUM PO Take 1 tablet by mouth 2 (two) times daily.    [provider]  meloxicam (MOBIC) 15 MG tablet TAKE ONE TABLET BY MOUTH DAILY 02/18/17   Birdie Sons, MD  Multiple Vitamins-Minerals (MULTIVITAMIN ADULT PO) Take 1 tablet by mouth daily.    [provider]  omeprazole (PRILOSEC) 20 MG capsule Take 1 capsule (20 mg total) by mouth daily. Patient not taking: Reported on 11/27/2016 06/28/15   Birdie Sons, MD  ondansetron (ZOFRAN ODT) 4 MG disintegrating tablet Take 1 tablet (4 mg total) by mouth every 8 (eight) hours as needed for nausea or vomiting. 07/05/17   Rudene Re, MD  Probiotic Product (PEARLS IC PO) Take 1 tablet by mouth daily.    [provider]  raloxifene (EVISTA) 60 MG tablet TAKE ONE TABLET BY MOUTH EVERY DAY 12/26/16   Birdie Sons, MD  triamcinolone cream (KENALOG) 0.5 % Apply 1 application topically 2 (two) times daily. To affected area of fingers Patient not taking: Reported on 11/27/2016 08/11/16   Birdie Sons, MD    Allergies Pravastatin sodium and Sulfa antibiotics  Family History  Problem Relation Age of Onset  . Bladder Cancer Brother   . Colon cancer Father   . Heart disease Neg Hx   . Stroke Neg Hx   . Diabetes Neg Hx        type 2  . Peripheral vascular disease Neg Hx     Social History Social History   Tobacco Use  . Smoking status: Former Smoker  Packs/day: 0.75    Years: 8.00    Pack years: 6.00    Types: Cigarettes    Last attempt to quit: 05/29/1960    Years since quitting: 57.1  . Smokeless tobacco: Never Used  Substance Use Topics  . Alcohol use: No    Alcohol/week: 0.0 oz  . Drug use: No    Review of Systems  Constitutional: Negative for fever. Eyes: Negative for visual changes. ENT: Negative for sore throat. Neck: No neck pain  Cardiovascular: Negative for chest pain. Respiratory: Negative for shortness of breath. Gastrointestinal: + abdominal pain,  nausea, and vomiting. No diarrhea. Genitourinary: Negative for dysuria. Musculoskeletal: Negative for back pain. Skin: Negative for rash. Neurological: Negative for headaches, weakness or numbness. Psych: No SI or HI  ____________________________________________   PHYSICAL EXAM:  VITAL SIGNS: ED Triage Vitals  Enc Vitals Group     BP 07/05/17 1742 123/75     Pulse Rate 07/05/17 1742 91     Resp 07/05/17 1742 18     Temp 07/05/17 1742 97.8 F (36.6 C)     Temp Source 07/05/17 1742 Oral     SpO2 07/05/17 1742 99 %     Weight 07/05/17 1742 135 lb (61.2 kg)     Height 07/05/17 1742 5\' 10"  (1.778 m)     Head Circumference --      Peak Flow --      Pain Score 07/05/17 1802 2     Pain Loc --      Pain Edu? --      Excl. in [place]? --     Constitutional: Alert and oriented. Well appearing and in no apparent distress. HEENT:      Head: Normocephalic and atraumatic.         Eyes: Conjunctivae are normal. Sclera is non-icteric.       Mouth/Throat: Mucous membranes are moist.       Neck: Supple with no signs of meningismus. Cardiovascular: Regular rate and rhythm. No murmurs, gallops, or rubs. 2+ symmetrical distal pulses are present in all extremities. No JVD. Respiratory: Normal respiratory effort. Lungs are clear to auscultation bilaterally. No wheezes, crackles, or rhonchi.  Gastrointestinal: Soft, non tender, and non distended with positive bowel sounds. No rebound or guarding. Musculoskeletal: Nontender with normal range of motion in all extremities. No edema, cyanosis, or erythema of extremities. Neurologic: Normal speech and language. Face is symmetric. Moving all extremities. No gross focal neurologic deficits are appreciated. Skin: Skin is warm, dry and intact. No rash noted. Psychiatric: Mood and affect are normal. Speech and behavior are normal.  ____________________________________________   LABS (all labs ordered are listed, but only abnormal results are  displayed)  Labs Reviewed  COMPREHENSIVE METABOLIC PANEL - Abnormal; Notable for the following components:      Result Value   Glucose, Bld 128 (*)    BUN 25 (*)    Creatinine, Ser 1.04 (*)    GFR calc non Af Amer 49 (*)    GFR calc Af Amer 56 (*)    All other components within normal limits  CBC - Abnormal; Notable for the following components:   Hemoglobin 11.2 (*)    HCT 34.0 (*)    RDW 15.7 (*)    All other components within normal limits  URINALYSIS, COMPLETE (UACMP) WITH MICROSCOPIC - Abnormal; Notable for the following components:   Color, Urine YELLOW (*)    APPearance CLOUDY (*)    Ketones, ur 20 (*)    Protein,  ur 30 (*)    All other components within normal limits  LIPASE, BLOOD  TROPONIN I   ____________________________________________  EKG   ED ECG REPORT I, Rudene Re, the attending physician, personally viewed and interpreted this ECG.  Normal sinus rhythm, rate of 79, normal intervals, normal axis, no ST elevations or depressions. Normal EKG.  ____________________________________________  RADIOLOGY  none  ____________________________________________   PROCEDURES  Procedure(s) performed: None Procedures Critical Care performed:  None ____________________________________________   INITIAL IMPRESSION / ASSESSMENT AND PLAN / ED COURSE   84 y.o. female with a history of osteopenia and osteoarthritis who presents for evaluation of nausea and vomiting and abdominal pain. 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 Most likely viral gastroenteritis. We'll do an EKG and troponin to rule out ACS. Patient were no diarrhea, no fever, no respiratory symptoms therefore do not believe this is flu. Her abdomen is soft with no tenderness throughout, normal bowel sounds, no distention. She is passing flatus, no history of SBO, normal bowel movement today. We'll give IV fluids, Zofran, check basic labs to rule out dehydration or electrolyte abnormalities.  Clinical  Course as of Jul 06 2155  Thu Jul 05, 2017  2154 Patient feels markedly improved area tolerating by mouth. Labs within normal limits. She is going to be discharged home on Zofran. Discussed return precautions with patient and her son.  [CV]    Clinical Course User Index [CV] Alfred Levins [place], MD     As part of my medical decision making, I reviewed the following data within the [facility] notes reviewed and incorporated, Labs reviewed , EKG interpreted , Notes from prior ED visits and [place] Controlled Substance Database    Pertinent labs & imaging results that were available during my care of the patient were reviewed by me and considered in my medical decision making (see chart for details).    ____________________________________________   FINAL CLINICAL IMPRESSION(S) / ED DIAGNOSES  Final diagnoses:  Non-intractable vomiting with nausea, unspecified vomiting type  Viral illness      NEW MEDICATIONS STARTED DURING THIS VISIT:  ED Discharge Orders        Ordered    ondansetron (ZOFRAN ODT) 4 MG disintegrating tablet  Every 8 hours PRN     07/05/17 2155       Note:  This document was prepared using Dragon voice recognition software and may include unintentional dictation errors.    Rudene Re, MD 07/05/17 2157

## 2017-07-05 NOTE — ED Triage Notes (Signed)
Pt arrived with son from home. Pt worked today and got home around noon. Pt had one episode of emesis after getting home but has had persistent nausea since. Pt also reports a burning upper GI/adomen pain and states it is intermittent.

## 2017-07-05 NOTE — ED Notes (Signed)
Pt ambulatory to toilet with this RN.  

## 2017-09-21 ENCOUNTER — Other Ambulatory Visit: Payer: Self-pay | Admitting: Family Medicine

## 2017-09-21 DIAGNOSIS — M25551 Pain in right hip: Secondary | ICD-10-CM

## 2017-10-25 ENCOUNTER — Telehealth: Payer: Self-pay | Admitting: Internal Medicine

## 2017-10-25 NOTE — Telephone Encounter (Signed)
No answer left patient  a vmsg to return call for an appt.

## 2017-10-29 ENCOUNTER — Encounter: Payer: Self-pay | Admitting: *Deleted

## 2017-10-30 ENCOUNTER — Ambulatory Visit
Admission: RE | Admit: 2017-10-30 | Discharge: 2017-10-30 | Disposition: A | Discharge status: Home / Self Care | Payer: Medicare Other | Source: Ambulatory Visit | Attending: Internal Medicine | Admitting: Internal Medicine

## 2017-10-30 ENCOUNTER — Encounter
Admission: RE | Disposition: A | Discharge status: Home / Self Care | Payer: Self-pay | Source: Ambulatory Visit | Attending: Internal Medicine

## 2017-10-30 ENCOUNTER — Ambulatory Visit: Payer: Medicare Other | Admitting: Anesthesiology

## 2017-10-30 ENCOUNTER — Encounter: Payer: Self-pay | Admitting: *Deleted

## 2017-10-30 DIAGNOSIS — Z882 Allergy status to sulfonamides status: Secondary | ICD-10-CM | POA: Diagnosis not present

## 2017-10-30 DIAGNOSIS — K5730 Diverticulosis of large intestine without perforation or abscess without bleeding: Secondary | ICD-10-CM | POA: Diagnosis not present

## 2017-10-30 DIAGNOSIS — Z87891 Personal history of nicotine dependence: Secondary | ICD-10-CM | POA: Diagnosis not present

## 2017-10-30 DIAGNOSIS — K449 Diaphragmatic hernia without obstruction or gangrene: Secondary | ICD-10-CM | POA: Diagnosis not present

## 2017-10-30 DIAGNOSIS — Z7982 Long term (current) use of aspirin: Secondary | ICD-10-CM | POA: Insufficient documentation

## 2017-10-30 DIAGNOSIS — D509 Iron deficiency anemia, unspecified: Secondary | ICD-10-CM | POA: Diagnosis present

## 2017-10-30 DIAGNOSIS — K219 Gastro-esophageal reflux disease without esophagitis: Secondary | ICD-10-CM | POA: Diagnosis not present

## 2017-10-30 DIAGNOSIS — Z888 Allergy status to other drugs, medicaments and biological substances status: Secondary | ICD-10-CM | POA: Insufficient documentation

## 2017-10-30 DIAGNOSIS — K2950 Unspecified chronic gastritis without bleeding: Secondary | ICD-10-CM | POA: Insufficient documentation

## 2017-10-30 DIAGNOSIS — Z86010 Personal history of colonic polyps: Secondary | ICD-10-CM | POA: Insufficient documentation

## 2017-10-30 DIAGNOSIS — K640 First degree hemorrhoids: Secondary | ICD-10-CM | POA: Insufficient documentation

## 2017-10-30 DIAGNOSIS — R634 Abnormal weight loss: Secondary | ICD-10-CM | POA: Diagnosis not present

## 2017-10-30 DIAGNOSIS — Z79899 Other long term (current) drug therapy: Secondary | ICD-10-CM | POA: Insufficient documentation

## 2017-10-30 DIAGNOSIS — M1990 Unspecified osteoarthritis, unspecified site: Secondary | ICD-10-CM | POA: Diagnosis not present

## 2017-10-30 DIAGNOSIS — B9681 Helicobacter pylori [H. pylori] as the cause of diseases classified elsewhere: Secondary | ICD-10-CM | POA: Diagnosis not present

## 2017-10-30 DIAGNOSIS — K3189 Other diseases of stomach and duodenum: Secondary | ICD-10-CM | POA: Diagnosis not present

## 2017-10-30 HISTORY — DX: Pure hypercholesterolemia, unspecified: E78.00

## 2017-10-30 HISTORY — PX: ESOPHAGOGASTRODUODENOSCOPY (EGD) WITH PROPOFOL: SHX5813

## 2017-10-30 HISTORY — DX: Drug induced constipation: K59.03

## 2017-10-30 HISTORY — DX: Unspecified osteoarthritis, unspecified site: M19.90

## 2017-10-30 HISTORY — PX: COLONOSCOPY WITH PROPOFOL: SHX5780

## 2017-10-30 SURGERY — ESOPHAGOGASTRODUODENOSCOPY (EGD) WITH PROPOFOL
Anesthesia: General

## 2017-10-30 MED ORDER — FENTANYL CITRATE (PF) 100 MCG/2ML IJ SOLN
INTRAMUSCULAR | Status: AC
  Filled 2017-10-30: qty 2

## 2017-10-30 MED ORDER — FENTANYL CITRATE (PF) 250 MCG/5ML IJ SOLN
INTRAMUSCULAR | Status: DC | PRN
  Administered 2017-10-30: 50 ug via INTRAVENOUS

## 2017-10-30 MED ORDER — LIDOCAINE HCL (CARDIAC) PF 100 MG/5ML IV SOSY
PREFILLED_SYRINGE | INTRAVENOUS | Status: DC | PRN
  Administered 2017-10-30: 100 mg via INTRAVENOUS

## 2017-10-30 MED ORDER — PROPOFOL 500 MG/50ML IV EMUL
INTRAVENOUS | Status: DC | PRN
  Administered 2017-10-30: 100 ug/kg/min via INTRAVENOUS

## 2017-10-30 MED ORDER — SODIUM CHLORIDE 0.9 % IV SOLN
INTRAVENOUS | Status: DC
  Administered 2017-10-30: 1000 mL via INTRAVENOUS

## 2017-10-30 MED ORDER — PROPOFOL 10 MG/ML IV BOLUS
INTRAVENOUS | Status: DC | PRN
  Administered 2017-10-30 (×2): 50 mg via INTRAVENOUS

## 2017-10-30 MED ORDER — MIDAZOLAM HCL 2 MG/2ML IJ SOLN
INTRAMUSCULAR | Status: DC | PRN
  Administered 2017-10-30: 2 mg via INTRAVENOUS

## 2017-10-30 MED ORDER — MIDAZOLAM HCL 2 MG/2ML IJ SOLN
INTRAMUSCULAR | Status: AC
  Filled 2017-10-30: qty 2

## 2017-10-30 NOTE — Transfer of Care (Signed)
Immediate Anesthesia Transfer of Care Note  Patient: Amanda Meyer  Procedure(s) Performed: ESOPHAGOGASTRODUODENOSCOPY (EGD) WITH PROPOFOL (N/A ) COLONOSCOPY WITH PROPOFOL (N/A )  Patient Location: PACU  Anesthesia Type:General  Level of Consciousness: sedated  Airway & Oxygen Therapy: Patient Spontanous Breathing and Patient connected to nasal cannula oxygen  Post-op Assessment: Report given to RN and Post -op Vital signs reviewed and stable  Post vital signs: Reviewed and stable  Last Vitals:  Vitals Value Taken Time  BP 131/71 10/30/2017 11:03 AM  Temp 36.1 C 10/30/2017 11:02 AM  Pulse 64 10/30/2017 11:09 AM  Resp 14 10/30/2017 11:09 AM  SpO2 98 % 10/30/2017 11:09 AM  Vitals shown include unvalidated device data.  Last Pain:  Vitals:   10/30/17 1102  TempSrc: Tympanic  PainSc:          Complications: No apparent anesthesia complications

## 2017-10-30 NOTE — Anesthesia Preprocedure Evaluation (Signed)
Anesthesia Evaluation  Patient identified by MRN, date of birth, ID band Patient awake    Reviewed: Allergy & Precautions, H&P , NPO status , Patient's Chart, lab work & pertinent test results, reviewed documented beta blocker date and time   History of Anesthesia Complications Negative for: history of anesthetic complications  Airway Mallampati: I  TM Distance: >3 FB Neck ROM: full    Dental  (+) Caps, Dental Advidsory Given, Teeth Intact, Chipped   Pulmonary neg pulmonary ROS, former smoker,           Cardiovascular Exercise Tolerance: Good negative cardio ROS       Neuro/Psych negative neurological ROS  negative psych ROS   GI/Hepatic Neg liver ROS, GERD  ,  Endo/Other  negative endocrine ROS  Renal/GU negative Renal ROS  negative genitourinary   Musculoskeletal   Abdominal   Peds  Hematology negative hematology ROS (+)   Anesthesia Other Findings Past Medical History: No date: Drug induced constipation No date: History of colon polyps No date: Hypercholesterolemia No date: OA (osteoarthritis)   Reproductive/Obstetrics negative OB ROS                             Anesthesia Physical Anesthesia Plan  ASA: II  Anesthesia Plan: General   Post-op Pain Management:    Induction: Intravenous  PONV Risk Score and Plan: 3 and Propofol infusion  Airway Management Planned: Nasal Cannula  Additional Equipment:   Intra-op Plan:   Post-operative Plan:   Informed Consent: I have reviewed the patients History and Physical, chart, labs and discussed the procedure including the risks, benefits and alternatives for the proposed anesthesia with the patient or authorized representative who has indicated his/her understanding and acceptance.   Dental Advisory Given  Plan Discussed with: Anesthesiologist, CRNA and Surgeon  Anesthesia Plan Comments:         Anesthesia Quick  Evaluation

## 2017-10-30 NOTE — H&P (Signed)
Outpatient short stay form Pre-procedure 10/30/2017 10:20 AM Ry Moody K. Alice Reichert, M.D.  Primary Physician: Frazier Richards, M.D.  Reason for visit:  Iron deficiency anemia, weight loss, personal hx of colon polyps.  History of present illness:  Patient has a personal hx of colon polyps and was recently diagnosed with iron deficiciency anemia. Denies change in bowel habits.     Current Facility-Administered Medications:  .  0.9 %  sodium chloride infusion, , Intravenous, Continuous, [place], Benay Pike, MD, Last Rate: 20 mL/hr at 10/30/17 1019, 1,000 mL at 10/30/17 1019  Medications Prior to Admission  Medication Sig Dispense Refill Last Dose  . aspirin 81 MG tablet Take 1 tablet by mouth daily.   Past Week at Unknown time  . calcium carbonate (OSCAL) 1500 (600 CA) MG TABS tablet Take 1 tablet by mouth daily.   Past Week at Unknown time  . cholecalciferol (VITAMIN D) 1000 UNITS tablet Take 1 tablet by mouth daily.   Past Week at Unknown time  . MAGNESIUM PO Take 1 tablet by mouth 2 (two) times daily.   Past Week at Unknown time  . Multiple Vitamins-Minerals (MULTIVITAMIN ADULT PO) Take 1 tablet by mouth daily.   Past Week at Unknown time  . omeprazole (PRILOSEC) 20 MG capsule Take 1 capsule (20 mg total) by mouth daily. 60 capsule 12 Past Week at Unknown time  . ondansetron (ZOFRAN ODT) 4 MG disintegrating tablet Take 1 tablet (4 mg total) by mouth every 8 (eight) hours as needed for nausea or vomiting. 20 tablet 0 Past Week at Unknown time  . raloxifene (EVISTA) 60 MG tablet TAKE ONE TABLET BY MOUTH EVERY DAY 30 tablet 12 Past Week at Unknown time  . triamcinolone cream (KENALOG) 0.5 % Apply 1 application topically 2 (two) times daily. To affected area of fingers 15 g 1 Past Week at Unknown time  . meloxicam (MOBIC) 15 MG tablet TAKE ONE TABLET BY MOUTH EVERY DAY (Patient not taking: Reported on 10/30/2017) 30 tablet 5 Not Taking at Unknown time  . Probiotic Product (PEARLS IC PO) Take 1 tablet  by mouth daily.   Not Taking at Unknown time     Allergies  Allergen Reactions  . Pravastatin Sodium     Fatigue  . Sulfa Antibiotics Rash     Past Medical History:  Diagnosis Date  . Drug induced constipation   . History of colon polyps   . Hypercholesterolemia   . OA (osteoarthritis)     Review of systems:  Negative.   Physical Exam  Gen: Alert, oriented. Appears stated age.  HEENT: [place]/AT. PERRLA. Lungs: CTA, no wheezes. CV: RR nl S1, S2. Abd: soft, benign, no masses. BS+ Ext: No edema. Pulses 2+    Planned procedures: Proceed with colonoscopy and EGD. The patient understands the nature of the planned procedure, indications, risks, alternatives and potential complications including but not limited to bleeding, infection, perforation, damage to internal organs and possible oversedation/side effects from anesthesia. The patient agrees and gives consent to proceed.  Please refer to procedure notes for findings, recommendations and patient disposition/instructions.    Alpheus Stiff K. Alice Reichert, M.D. Gastroenterology 10/30/2017  10:20 AM

## 2017-10-30 NOTE — Op Note (Addendum)
[facility] Gastroenterology Patient Name: Amanda Meyer Procedure Date: 10/30/2017 10:23 AM MRN: [id number] Account #: [id number] Date of Birth: [birth year] Admit Type: Outpatient Age: 84 Room: [facility] ENDO ROOM 2 Gender: Female Note Status: Finalized Procedure:            Colonoscopy Indications:          Unexplained iron deficiency anemia Providers:            Benay Pike. Alice Reichert MD, MD Referring MD:         Ocie Cornfield. Ouida Sills MD, MD (Referring MD) Medicines:            Propofol per Anesthesia Complications:        No immediate complications. Procedure:            Pre-Anesthesia Assessment:                       - The risks and benefits of the procedure and the                        sedation options and risks were discussed with the                        patient. All questions were answered and informed                        consent was obtained.                       - Patient identification and proposed procedure were                        verified prior to the procedure by the nurse. The                        procedure was verified in the procedure room.                       - ASA Grade Assessment: II - A patient with mild                        systemic disease.                       - After reviewing the risks and benefits, the patient                        was deemed in satisfactory condition to undergo the                        procedure.                       After obtaining informed consent, the colonoscope was                        passed under direct vision. Throughout the procedure,                        the patient's blood pressure, pulse, and oxygen  saturations were monitored continuously. The                        Colonoscope was introduced through the anus and                        advanced to the the cecum, identified by appendiceal                        orifice and ileocecal valve. The colonoscopy was                performed without difficulty. The patient tolerated the                        procedure well. The quality of the bowel preparation                        was good. The ileocecal valve, appendiceal orifice, and                        rectum were photographed. Findings:      The perianal and digital rectal examinations were normal. Pertinent       negatives include normal sphincter tone and no palpable rectal lesions.      A few small-mouthed diverticula were found in the sigmoid colon.      Non-bleeding internal hemorrhoids were found during retroflexion. The       hemorrhoids were Grade I (internal hemorrhoids that do not prolapse).      The exam was otherwise without abnormality. Impression:           - Diverticulosis in the sigmoid colon.                       - Non-bleeding internal hemorrhoids.                       - The examination was otherwise normal.                       - No specimens collected. Recommendation:       - Patient has a contact number available for                        emergencies. The signs and symptoms of potential                        delayed complications were discussed with the patient.                        Return to normal activities tomorrow. Written discharge                        instructions were provided to the patient.                       - Advance diet as tolerated.                       - Continue present medications.                       - NO  repeat colonoscopy due to age/comorbid status                       - Will consider small bowel wireless capsule endoscopy                        based upon review of pathology results.                       - Return to physician assistant in 4 weeks.                       - The findings and recommendations were discussed with                        the patient and their family. Procedure Code(s):    --- Professional ---                       [phone], Colonoscopy, flexible; diagnostic,  including                        collection of specimen(s) by brushing or washing, when                        performed (separate procedure) Diagnosis Code(s):    --- Professional ---                       K57.30, Diverticulosis of large intestine without                        perforation or abscess without bleeding                       D50.9, Iron deficiency anemia, unspecified                       K64.0, First degree hemorrhoids CPT copyright 2017 American Medical Association. All rights reserved. The codes documented in this report are preliminary and upon coder review may  be revised to meet current compliance requirements. Efrain Sella MD, MD 10/30/2017 11:03:34 AM This report has been signed electronically. Number of Addenda: 0 Note Initiated On: 10/30/2017 10:23 AM Scope Withdrawal Time: 0 hours 6 minutes 29 seconds  Total Procedure Duration: 0 hours 14 minutes 1 second       [facility]

## 2017-10-30 NOTE — Interval H&P Note (Signed)
History and Physical Interval Note:  10/30/2017 10:22 AM  Amanda Meyer  has presented today for surgery, with the diagnosis of IDA  The various methods of treatment have been discussed with the patient and family. After consideration of risks, benefits and other options for treatment, the patient has consented to  Procedure(s): ESOPHAGOGASTRODUODENOSCOPY (EGD) WITH PROPOFOL (N/A) COLONOSCOPY WITH PROPOFOL (N/A) as a surgical intervention .  The patient's history has been reviewed, patient examined, no change in status, stable for surgery.  I have reviewed the patient's chart and labs.  Questions were answered to the patient's satisfaction.     [place], [place]

## 2017-10-30 NOTE — Op Note (Signed)
[facility] Gastroenterology Patient Name: Amanda Meyer Procedure Date: 10/30/2017 10:24 AM MRN: [id number] Account #: [id number] Date of Birth: [birth year] Admit Type: Outpatient Age: 84 Room: [facility] ENDO ROOM 2 Gender: Female Note Status: Finalized Procedure:            Upper GI endoscopy Indications:          Suspected upper gastrointestinal bleeding in patient                        with unexplained iron deficiency anemia Providers:            Benay Pike. Alice Reichert MD, MD Referring MD:         Ocie Cornfield. Ouida Sills MD, MD (Referring MD) Medicines:            Propofol per Anesthesia Complications:        No immediate complications. Procedure:            Pre-Anesthesia Assessment:                       - The risks and benefits of the procedure and the                        sedation options and risks were discussed with the                        patient. All questions were answered and informed                        consent was obtained.                       - Patient identification and proposed procedure were                        verified prior to the procedure by the nurse. The                        procedure was verified in the procedure room.                       - ASA Grade Assessment: II - A patient with mild                        systemic disease.                       - After reviewing the risks and benefits, the patient                        was deemed in satisfactory condition to undergo the                        procedure.                       After obtaining informed consent, the endoscope was                        passed under direct vision. Throughout the procedure,  the patient's blood pressure, pulse, and oxygen                        saturations were monitored continuously. The Endoscope                        was introduced through the mouth, and advanced to the                        third part of duodenum. The upper  GI endoscopy was                        accomplished without difficulty. The patient tolerated                        the procedure well. Findings:      Moderate tortuosity of the mid to distal esophagus was noted compatible       with a diagnosis of Presbyesophagus.      Diffuse mildly erythematous mucosa without bleeding was found in the       stomach. Biopsies were taken with a cold forceps for Helicobacter pylori       testing.      A medium-sized hiatal hernia was present.      The examined duodenum was normal. Biopsies for histology were taken with       a cold forceps for evaluation of celiac disease. Impression:           - Erythematous mucosa in the stomach. Biopsied.                       - Medium-sized hiatal hernia.                       - Normal examined duodenum. Biopsied. Recommendation:       - Await pathology results.                       - Proceed with colonoscopy Procedure Code(s):    --- Professional ---                       [phone], Esophagogastroduodenoscopy, flexible, transoral;                        with biopsy, single or multiple Diagnosis Code(s):    --- Professional ---                       D50.9, Iron deficiency anemia, unspecified                       K44.9, Diaphragmatic hernia without obstruction or                        gangrene                       K31.89, Other diseases of stomach and duodenum CPT copyright 2017 American Medical Association. All rights reserved. The codes documented in this report are preliminary and upon coder review may  be revised to meet current compliance requirements. Efrain Sella MD, MD 10/30/2017 10:42:10 AM This report has been signed electronically. Number of Addenda: 0 Note Initiated  On: 10/30/2017 10:24 AM      [facility]

## 2017-10-30 NOTE — Anesthesia Postprocedure Evaluation (Signed)
Anesthesia Post Note  Patient: Nicle Connole  Procedure(s) Performed: ESOPHAGOGASTRODUODENOSCOPY (EGD) WITH PROPOFOL (N/A ) COLONOSCOPY WITH PROPOFOL (N/A )  Patient location during evaluation: Endoscopy Anesthesia Type: General Level of consciousness: awake and alert Pain management: pain level controlled Vital Signs Assessment: post-procedure vital signs reviewed and stable Respiratory status: spontaneous breathing, nonlabored ventilation, respiratory function stable and patient connected to nasal cannula oxygen Cardiovascular status: blood pressure returned to baseline and stable Postop Assessment: no apparent nausea or vomiting Anesthetic complications: no     Last Vitals:  Vitals:   10/30/17 0949 10/30/17 1102  BP: 138/80 131/71  Pulse: 85   Resp: 18   Temp: (!) 35.9 C (!) 36.1 C  SpO2: 100%     Last Pain:  Vitals:   10/30/17 1102  TempSrc: Tympanic  PainSc:                  Martha Clan

## 2017-10-30 NOTE — Anesthesia Post-op Follow-up Note (Signed)
Anesthesia QCDR form completed.        

## 2017-10-31 LAB — SURGICAL PATHOLOGY

## 2017-11-01 ENCOUNTER — Encounter: Payer: Self-pay | Admitting: Internal Medicine

## 2018-01-21 ENCOUNTER — Other Ambulatory Visit: Payer: Self-pay | Admitting: Family Medicine

## 2018-02-21 ENCOUNTER — Other Ambulatory Visit: Payer: Self-pay | Admitting: Family Medicine

## 2018-03-22 ENCOUNTER — Other Ambulatory Visit: Payer: Self-pay | Admitting: Family Medicine

## 2018-04-20 ENCOUNTER — Encounter: Payer: Self-pay | Admitting: Emergency Medicine

## 2018-04-20 ENCOUNTER — Emergency Department: Payer: Medicare Other

## 2018-04-20 ENCOUNTER — Inpatient Hospital Stay
Admission: EM | Admit: 2018-04-20 | Discharge: 2018-04-21 | DRG: 392 | Disposition: A | Discharge status: Home / Self Care | Payer: Medicare Other | Attending: Internal Medicine | Admitting: Internal Medicine

## 2018-04-20 ENCOUNTER — Other Ambulatory Visit: Payer: Self-pay

## 2018-04-20 DIAGNOSIS — Z882 Allergy status to sulfonamides status: Secondary | ICD-10-CM | POA: Diagnosis not present

## 2018-04-20 DIAGNOSIS — Z885 Allergy status to narcotic agent status: Secondary | ICD-10-CM | POA: Diagnosis not present

## 2018-04-20 DIAGNOSIS — D509 Iron deficiency anemia, unspecified: Secondary | ICD-10-CM | POA: Diagnosis not present

## 2018-04-20 DIAGNOSIS — Z87891 Personal history of nicotine dependence: Secondary | ICD-10-CM

## 2018-04-20 DIAGNOSIS — N179 Acute kidney failure, unspecified: Secondary | ICD-10-CM | POA: Diagnosis present

## 2018-04-20 DIAGNOSIS — R42 Dizziness and giddiness: Secondary | ICD-10-CM

## 2018-04-20 DIAGNOSIS — E559 Vitamin D deficiency, unspecified: Secondary | ICD-10-CM | POA: Diagnosis not present

## 2018-04-20 DIAGNOSIS — M1990 Unspecified osteoarthritis, unspecified site: Secondary | ICD-10-CM | POA: Diagnosis present

## 2018-04-20 DIAGNOSIS — K2900 Acute gastritis without bleeding: Secondary | ICD-10-CM

## 2018-04-20 DIAGNOSIS — Z800 Family history of malignant neoplasm of digestive organs: Secondary | ICD-10-CM

## 2018-04-20 DIAGNOSIS — Z79899 Other long term (current) drug therapy: Secondary | ICD-10-CM

## 2018-04-20 DIAGNOSIS — K5909 Other constipation: Principal | ICD-10-CM | POA: Diagnosis present

## 2018-04-20 DIAGNOSIS — E785 Hyperlipidemia, unspecified: Secondary | ICD-10-CM | POA: Diagnosis present

## 2018-04-20 DIAGNOSIS — Z888 Allergy status to other drugs, medicaments and biological substances status: Secondary | ICD-10-CM | POA: Diagnosis not present

## 2018-04-20 DIAGNOSIS — R1013 Epigastric pain: Secondary | ICD-10-CM

## 2018-04-20 DIAGNOSIS — Z8052 Family history of malignant neoplasm of bladder: Secondary | ICD-10-CM

## 2018-04-20 DIAGNOSIS — E860 Dehydration: Secondary | ICD-10-CM | POA: Diagnosis present

## 2018-04-20 DIAGNOSIS — K529 Noninfective gastroenteritis and colitis, unspecified: Secondary | ICD-10-CM | POA: Diagnosis present

## 2018-04-20 DIAGNOSIS — E7800 Pure hypercholesterolemia, unspecified: Secondary | ICD-10-CM | POA: Diagnosis not present

## 2018-04-20 DIAGNOSIS — Z8719 Personal history of other diseases of the digestive system: Secondary | ICD-10-CM | POA: Diagnosis not present

## 2018-04-20 DIAGNOSIS — Z7982 Long term (current) use of aspirin: Secondary | ICD-10-CM | POA: Diagnosis not present

## 2018-04-20 DIAGNOSIS — M8580 Other specified disorders of bone density and structure, unspecified site: Secondary | ICD-10-CM | POA: Diagnosis not present

## 2018-04-20 DIAGNOSIS — R112 Nausea with vomiting, unspecified: Secondary | ICD-10-CM

## 2018-04-20 DIAGNOSIS — K219 Gastro-esophageal reflux disease without esophagitis: Secondary | ICD-10-CM | POA: Diagnosis present

## 2018-04-20 LAB — CBC
HCT: 41.7 % (ref 36.0–46.0)
HEMOGLOBIN: 13.6 g/dL (ref 12.0–15.0)
MCH: 31 pg (ref 26.0–34.0)
MCHC: 32.6 g/dL (ref 30.0–36.0)
MCV: 95 fL (ref 80.0–100.0)
NRBC: 0 % (ref 0.0–0.2)
Platelets: 175 10*3/uL (ref 150–400)
RBC: 4.39 MIL/uL (ref 3.87–5.11)
RDW: 13.2 % (ref 11.5–15.5)
WBC: 8.6 10*3/uL (ref 4.0–10.5)

## 2018-04-20 LAB — COMPREHENSIVE METABOLIC PANEL
ALBUMIN: 3.8 g/dL (ref 3.5–5.0)
ALT: 16 U/L (ref 0–44)
AST: 29 U/L (ref 15–41)
Alkaline Phosphatase: 56 U/L (ref 38–126)
Anion gap: 12 (ref 5–15)
BUN: 24 mg/dL — AB (ref 8–23)
CO2: 22 mmol/L (ref 22–32)
Calcium: 8.8 mg/dL — ABNORMAL LOW (ref 8.9–10.3)
Chloride: 106 mmol/L (ref 98–111)
Creatinine, Ser: 1.05 mg/dL — ABNORMAL HIGH (ref 0.44–1.00)
GFR calc Af Amer: 55 mL/min — ABNORMAL LOW (ref >60)
GFR calc non Af Amer: 48 mL/min — ABNORMAL LOW (ref >60)
GLUCOSE: 127 mg/dL — AB (ref 70–99)
Potassium: 4 mmol/L (ref 3.5–5.1)
SODIUM: 140 mmol/L (ref 135–145)
Total Bilirubin: 0.7 mg/dL (ref 0.3–1.2)
Total Protein: 6.6 g/dL (ref 6.5–8.1)

## 2018-04-20 LAB — LIPASE, BLOOD: Lipase: 43 U/L (ref 11–51)

## 2018-04-20 MED ORDER — SODIUM CHLORIDE 0.9 % IV BOLUS
1000.0000 mL | Freq: Once | INTRAVENOUS | Status: AC
  Administered 2018-04-20: 1000 mL via INTRAVENOUS

## 2018-04-20 MED ORDER — SODIUM CHLORIDE 0.9 % IV SOLN
1000.0000 mL | Freq: Once | INTRAVENOUS | Status: AC
  Administered 2018-04-20: 1000 mL via INTRAVENOUS

## 2018-04-20 MED ORDER — ONDANSETRON HCL 4 MG/2ML IJ SOLN
4.0000 mg | Freq: Once | INTRAMUSCULAR | Status: DC
Start: 2018-04-20 — End: 2018-04-20

## 2018-04-20 MED ORDER — ONDANSETRON HCL 4 MG/2ML IJ SOLN
4.0000 mg | Freq: Once | INTRAMUSCULAR | Status: AC
  Administered 2018-04-20: 4 mg via INTRAVENOUS
  Filled 2018-04-20: qty 2

## 2018-04-20 MED ORDER — MORPHINE SULFATE (PF) 2 MG/ML IV SOLN
2.0000 mg | Freq: Once | INTRAVENOUS | Status: AC
  Administered 2018-04-20: 2 mg via INTRAVENOUS
  Filled 2018-04-20: qty 1

## 2018-04-20 NOTE — ED Notes (Addendum)
Pt states pain has returned. Vomited approximately 100 ml of brownish liquid. Dr Corky Downs informed.

## 2018-04-20 NOTE — ED Triage Notes (Signed)
EMS pt to rm 5 from home with report of epigastric pain for 1 hour. Vomited x 1 on way to hospital. Pain started around 6 pm and is intermittent and aching in nature.

## 2018-04-20 NOTE — ED Provider Notes (Signed)
[facility] Emergency Department Provider Note   ____________________________________________    I have reviewed the triage vital signs and the nursing notes.   HISTORY  Chief Complaint Abdominal Pain and Emesis     HPI Amanda Meyer is a 84 y.o. female who presents with complaints of epigastric discomfort nausea and vomiting.  Patient reports the symptoms started around 6 PM today.  She had been feeling well earlier in the day but then she started to feel sick to her stomach.  She developed a burning discomfort in her epigastrium.  She called EMS and on the way over she vomited which did improve her pain significantly.  She continues to feel dizzy and lightheaded.  No chest pain.  No shortness of breath.  She received Zofran via EMS  Past Medical History:  Diagnosis Date  . Drug induced constipation   . History of colon polyps   . Hypercholesterolemia   . OA (osteoarthritis)     Patient Active Problem List   Diagnosis Date Noted  . Altered bowel function 05/04/2015  . GERD (gastroesophageal reflux disease) 05/04/2015  . Fatigue 05/04/2015  . History of colonic polyps 05/04/2015  . HLD (hyperlipidemia) 05/04/2015  . Iron deficiency anemia 05/04/2015  . Osteoarthritis 05/04/2015  . Osteopenia 05/04/2015  . Vitamin D deficiency 05/04/2015    Past Surgical History:  Procedure Laterality Date  . COLONOSCOPY WITH PROPOFOL N/A 10/30/2017   Procedure: COLONOSCOPY WITH PROPOFOL;  Surgeon: [place], Benay Pike, MD;  Location: [facility] ENDOSCOPY;  Service: Gastroenterology;  Laterality: N/A;  . ESOPHAGOGASTRODUODENOSCOPY (EGD) WITH PROPOFOL N/A 10/30/2017   Procedure: ESOPHAGOGASTRODUODENOSCOPY (EGD) WITH PROPOFOL;  Surgeon: [place], Benay Pike, MD;  Location: [facility] ENDOSCOPY;  Service: Gastroenterology;  Laterality: N/A;  . FRACTURE SURGERY    . right wrist surgery  1990   broken wrist repair    Prior to Admission medications   Medication Sig Start Date End  Date Taking? Authorizing Provider  aspirin 81 MG tablet Take 1 tablet by mouth daily.   Yes [provider]  calcium carbonate (OSCAL) 1500 (600 CA) MG TABS tablet Take 1 tablet by mouth daily.   Yes [provider]  cholecalciferol (VITAMIN D) 1000 UNITS tablet Take 1 tablet by mouth daily. 10/17/11  Yes [provider]  magnesium hydroxide (MILK OF MAGNESIA) 400 MG/5ML suspension Take 10 mLs by mouth daily as needed for mild constipation.   Yes [provider]  Multiple Vitamins-Minerals (MULTIVITAMIN ADULT PO) Take 1 tablet by mouth daily.   Yes [provider]  omeprazole (PRILOSEC) 20 MG capsule Take 1 capsule (20 mg total) by mouth daily. 06/28/15  Yes Birdie Sons, MD  Probiotic Product (PEARLS IC PO) Take 1 tablet by mouth daily.   Yes [provider]  raloxifene (EVISTA) 60 MG tablet TAKE ONE TABLET EVERY DAY 01/21/18  Yes Birdie Sons, MD     Allergies Hydromorphone; Pravastatin sodium; and Sulfa antibiotics  Family History  Problem Relation Age of Onset  . Bladder Cancer Brother   . Colon cancer Father   . Heart disease Neg Hx   . Stroke Neg Hx   . Diabetes Neg Hx        type 2  . Peripheral vascular disease Neg Hx     Social History Social History   Tobacco Use  . Smoking status: Former Smoker    Packs/day: 0.75    Years: 8.00    Pack years: 6.00    Types: Cigarettes  Last attempt to quit: 05/29/1960    Years since quitting: 57.9  . Smokeless tobacco: Never Used  Substance Use Topics  . Alcohol use: No    Alcohol/week: 0.0 standard drinks  . Drug use: Never    Review of Systems  Constitutional: No fever/chills Eyes: No visual changes.  ENT: No sore throat. Cardiovascular: Denies chest pain. Respiratory: Denies shortness of breath. Gastrointestinal: As above Genitourinary: Negative for dysuria. Musculoskeletal: Right hip pain from recent surgery Skin: Negative for rash. Neurological: Negative  for headaches or weakness   ____________________________________________   PHYSICAL EXAM:  VITAL SIGNS: ED Triage Vitals  Enc Vitals Group     BP 04/20/18 2030 (!) 150/83     Pulse Rate 04/20/18 2030 83     Resp 04/20/18 2032 (!) 28     Temp 04/20/18 2032 97.6 F (36.4 C)     Temp Source 04/20/18 2032 Oral     SpO2 04/20/18 2030 98 %     Weight 04/20/18 2033 65.8 kg (145 lb)     Height 04/20/18 2033 1.753 m (5\' 9" )     Head Circumference --      Peak Flow --      Pain Score 04/20/18 2033 0     Pain Loc --      Pain Edu? --      Excl. in [place]? --     Constitutional: Alert and oriented. No acute distress.  Nose: No congestion/rhinnorhea. Mouth/Throat: Mucous membranes are moist.    Cardiovascular: Normal rate, regular rhythm. Grossly normal heart sounds.  Good peripheral circulation. Respiratory: Normal respiratory effort.  No retractions. Lungs CTAB. Gastrointestinal: Soft and nontender. No distention.  No right upper quadrant tenderness, no epigastric tenderness  Musculoskeletal: No lower extremity tenderness nor edema.  Warm and well perfused Neurologic:  Normal speech and language. No gross focal neurologic deficits are appreciated.  Skin:  Skin is warm, dry and intact. No rash noted. Psychiatric: Mood and affect are normal. Speech and behavior are normal.  ____________________________________________   LABS (all labs ordered are listed, but only abnormal results are displayed)  Labs Reviewed  COMPREHENSIVE METABOLIC PANEL - Abnormal; Notable for the following components:      Result Value   Glucose, Bld 127 (*)    BUN 24 (*)    Creatinine, Ser 1.05 (*)    Calcium 8.8 (*)    GFR calc non Af Amer 48 (*)    GFR calc Af Amer 55 (*)    All other components within normal limits  CBC  LIPASE, BLOOD    ____________________________________________  EKG  None ____________________________________________  RADIOLOGY  None ____________________________________________   PROCEDURES  Procedure(s) performed: No  Procedures   Critical Care performed: No ____________________________________________   INITIAL IMPRESSION / ASSESSMENT AND PLAN / ED COURSE  Pertinent labs & imaging results that were available during my care of the patient were reviewed by me and considered in my medical decision making (see chart for details).  Patient presents with nausea and vomiting mild epigastric discomfort, suspect gastritis.  No right upper quadrant tenderness to suggest cholecystitis or cholelithiasis.  Lipase is normal.  Abdominal exam is quite reassuring overall.  Will treat with IV Zofran IV fluids and reevaluate  I had the patient stand up to see how she would feel and she became quite lightheaded and almost fell.  We will give additional fluids and discussed with the hospitalist for admission, additional IV Zofran given as well    ____________________________________________  FINAL CLINICAL IMPRESSION(S) / ED DIAGNOSES  Final diagnoses:  Dizziness  Non-intractable vomiting with nausea, unspecified vomiting type  Acute gastritis without hemorrhage, unspecified gastritis type        Note:  This document was prepared using Dragon voice recognition software and may include unintentional dictation errors.    Lavonia Drafts, MD 04/20/18 [phone]

## 2018-04-21 ENCOUNTER — Inpatient Hospital Stay: Payer: Medicare Other

## 2018-04-21 DIAGNOSIS — Z79899 Other long term (current) drug therapy: Secondary | ICD-10-CM | POA: Diagnosis not present

## 2018-04-21 DIAGNOSIS — Z800 Family history of malignant neoplasm of digestive organs: Secondary | ICD-10-CM | POA: Diagnosis not present

## 2018-04-21 DIAGNOSIS — Z87891 Personal history of nicotine dependence: Secondary | ICD-10-CM | POA: Diagnosis not present

## 2018-04-21 DIAGNOSIS — E559 Vitamin D deficiency, unspecified: Secondary | ICD-10-CM | POA: Diagnosis not present

## 2018-04-21 DIAGNOSIS — Z8052 Family history of malignant neoplasm of bladder: Secondary | ICD-10-CM | POA: Diagnosis not present

## 2018-04-21 DIAGNOSIS — Z8719 Personal history of other diseases of the digestive system: Secondary | ICD-10-CM | POA: Diagnosis not present

## 2018-04-21 DIAGNOSIS — R42 Dizziness and giddiness: Secondary | ICD-10-CM | POA: Diagnosis present

## 2018-04-21 DIAGNOSIS — Z882 Allergy status to sulfonamides status: Secondary | ICD-10-CM | POA: Diagnosis not present

## 2018-04-21 DIAGNOSIS — K219 Gastro-esophageal reflux disease without esophagitis: Secondary | ICD-10-CM | POA: Diagnosis not present

## 2018-04-21 DIAGNOSIS — K5909 Other constipation: Secondary | ICD-10-CM | POA: Diagnosis not present

## 2018-04-21 DIAGNOSIS — Z7982 Long term (current) use of aspirin: Secondary | ICD-10-CM | POA: Diagnosis not present

## 2018-04-21 DIAGNOSIS — E785 Hyperlipidemia, unspecified: Secondary | ICD-10-CM | POA: Diagnosis not present

## 2018-04-21 DIAGNOSIS — K529 Noninfective gastroenteritis and colitis, unspecified: Secondary | ICD-10-CM | POA: Diagnosis present

## 2018-04-21 DIAGNOSIS — D509 Iron deficiency anemia, unspecified: Secondary | ICD-10-CM | POA: Diagnosis not present

## 2018-04-21 DIAGNOSIS — E860 Dehydration: Secondary | ICD-10-CM | POA: Diagnosis not present

## 2018-04-21 DIAGNOSIS — Z885 Allergy status to narcotic agent status: Secondary | ICD-10-CM | POA: Diagnosis not present

## 2018-04-21 DIAGNOSIS — M1990 Unspecified osteoarthritis, unspecified site: Secondary | ICD-10-CM | POA: Diagnosis not present

## 2018-04-21 DIAGNOSIS — E7800 Pure hypercholesterolemia, unspecified: Secondary | ICD-10-CM | POA: Diagnosis not present

## 2018-04-21 DIAGNOSIS — Z888 Allergy status to other drugs, medicaments and biological substances status: Secondary | ICD-10-CM | POA: Diagnosis not present

## 2018-04-21 DIAGNOSIS — N179 Acute kidney failure, unspecified: Secondary | ICD-10-CM | POA: Diagnosis not present

## 2018-04-21 LAB — CBC
HEMATOCRIT: 37.5 % (ref 36.0–46.0)
HEMOGLOBIN: 12 g/dL (ref 12.0–15.0)
MCH: 31.3 pg (ref 26.0–34.0)
MCHC: 32 g/dL (ref 30.0–36.0)
MCV: 97.7 fL (ref 80.0–100.0)
NRBC: 0 % (ref 0.0–0.2)
Platelets: 137 10*3/uL — ABNORMAL LOW (ref 150–400)
RBC: 3.84 MIL/uL — ABNORMAL LOW (ref 3.87–5.11)
RDW: 13.3 % (ref 11.5–15.5)
WBC: 6.9 10*3/uL (ref 4.0–10.5)

## 2018-04-21 LAB — BASIC METABOLIC PANEL
ANION GAP: 5 (ref 5–15)
BUN: 21 mg/dL (ref 8–23)
CO2: 24 mmol/L (ref 22–32)
Calcium: 7.6 mg/dL — ABNORMAL LOW (ref 8.9–10.3)
Chloride: 112 mmol/L — ABNORMAL HIGH (ref 98–111)
Creatinine, Ser: 0.91 mg/dL (ref 0.44–1.00)
GFR, EST NON AFRICAN AMERICAN: 57 mL/min — AB (ref >60)
GLUCOSE: 122 mg/dL — AB (ref 70–99)
POTASSIUM: 3.7 mmol/L (ref 3.5–5.1)
SODIUM: 141 mmol/L (ref 135–145)

## 2018-04-21 LAB — GLUCOSE, CAPILLARY
Glucose-Capillary: 112 mg/dL — ABNORMAL HIGH (ref 70–99)
Glucose-Capillary: 85 mg/dL (ref 70–99)

## 2018-04-21 MED ORDER — ACETAMINOPHEN 325 MG PO TABS: 650.0000 mg | ORAL_TABLET | Freq: Four times a day (QID) | ORAL | Status: DC | PRN

## 2018-04-21 MED ORDER — RISAQUAD PO CAPS
1.0000 | ORAL_CAPSULE | Freq: Every day | ORAL | Status: DC
  Administered 2018-04-21: 1 via ORAL
  Filled 2018-04-21: qty 1

## 2018-04-21 MED ORDER — ONDANSETRON HCL 4 MG PO TABS: 4.0000 mg | ORAL_TABLET | Freq: Four times a day (QID) | ORAL | Status: DC | PRN

## 2018-04-21 MED ORDER — PANTOPRAZOLE SODIUM 40 MG PO TBEC
40.0000 mg | DELAYED_RELEASE_TABLET | Freq: Every day | ORAL | Status: DC
  Administered 2018-04-21: 40 mg via ORAL
  Filled 2018-04-21: qty 1

## 2018-04-21 MED ORDER — ACETAMINOPHEN 650 MG RE SUPP: 650.0000 mg | Freq: Four times a day (QID) | RECTAL | Status: DC | PRN

## 2018-04-21 MED ORDER — ACETAMINOPHEN 325 MG PO TABS: 650.0000 mg | ORAL_TABLET | Freq: Four times a day (QID) | ORAL | Status: AC | PRN

## 2018-04-21 MED ORDER — BISACODYL 5 MG PO TBEC: 5.0000 mg | DELAYED_RELEASE_TABLET | Freq: Every day | ORAL | Status: DC | PRN

## 2018-04-21 MED ORDER — SODIUM CHLORIDE 0.9 % IV SOLN
INTRAVENOUS | Status: DC
  Administered 2018-04-21: 03:00:00 via INTRAVENOUS

## 2018-04-21 MED ORDER — TRAZODONE HCL 50 MG PO TABS: 25.0000 mg | ORAL_TABLET | Freq: Every evening | ORAL | Status: DC | PRN

## 2018-04-21 MED ORDER — RALOXIFENE HCL 60 MG PO TABS
60.0000 mg | ORAL_TABLET | Freq: Every day | ORAL | Status: DC
  Administered 2018-04-21: 60 mg via ORAL
  Filled 2018-04-21: qty 1

## 2018-04-21 MED ORDER — BISACODYL 5 MG PO TBEC: 5.0000 mg | DELAYED_RELEASE_TABLET | Freq: Every day | ORAL | 0 refills | Status: AC | PRN

## 2018-04-21 MED ORDER — MAGNESIUM HYDROXIDE 400 MG/5ML PO SUSP
10.0000 mL | Freq: Every day | ORAL | Status: DC | PRN
  Filled 2018-04-21: qty 30

## 2018-04-21 MED ORDER — ONDANSETRON HCL 4 MG/2ML IJ SOLN: 4.0000 mg | Freq: Four times a day (QID) | INTRAMUSCULAR | Status: DC | PRN

## 2018-04-21 MED ORDER — CALCIUM CARBONATE ANTACID 500 MG PO CHEW
3.0000 | CHEWABLE_TABLET | Freq: Every day | ORAL | Status: DC
  Administered 2018-04-21: 600 mg via ORAL
  Filled 2018-04-21: qty 3

## 2018-04-21 MED ORDER — DOCUSATE SODIUM 100 MG PO CAPS
100.0000 mg | ORAL_CAPSULE | Freq: Two times a day (BID) | ORAL | Status: DC
  Administered 2018-04-21: 100 mg via ORAL
  Filled 2018-04-21: qty 1

## 2018-04-21 MED ORDER — HEPARIN SODIUM (PORCINE) 5000 UNIT/ML IJ SOLN
5000.0000 [IU] | Freq: Three times a day (TID) | INTRAMUSCULAR | Status: DC
  Administered 2018-04-21: 5000 [IU] via SUBCUTANEOUS
  Filled 2018-04-21: qty 1

## 2018-04-21 MED ORDER — ASPIRIN EC 81 MG PO TBEC
81.0000 mg | DELAYED_RELEASE_TABLET | Freq: Every day | ORAL | Status: DC
  Administered 2018-04-21: 81 mg via ORAL
  Filled 2018-04-21: qty 1

## 2018-04-21 MED ORDER — ADULT MULTIVITAMIN W/MINERALS CH
1.0000 | ORAL_TABLET | Freq: Every day | ORAL | Status: DC
  Administered 2018-04-21: 1 via ORAL
  Filled 2018-04-21: qty 1

## 2018-04-21 MED ORDER — VITAMIN D 25 MCG (1000 UNIT) PO TABS
1000.0000 [IU] | ORAL_TABLET | Freq: Every day | ORAL | Status: DC
  Administered 2018-04-21: 1000 [IU] via ORAL
  Filled 2018-04-21: qty 1

## 2018-04-21 MED ORDER — POLYETHYLENE GLYCOL 3350 17 G PO PACK: 17.0000 g | PACK | Freq: Every day | ORAL | 0 refills | Status: AC

## 2018-04-21 MED ORDER — MORPHINE SULFATE (PF) 2 MG/ML IV SOLN: 2.0000 mg | INTRAVENOUS | Status: DC | PRN

## 2018-04-21 MED ORDER — DOCUSATE SODIUM 100 MG PO CAPS: 100.0000 mg | ORAL_CAPSULE | Freq: Every day | ORAL | 0 refills | Status: AC

## 2018-04-21 MED ORDER — POLYETHYLENE GLYCOL 3350 17 G PO PACK: 17.0000 g | PACK | Freq: Every day | ORAL | Status: DC

## 2018-04-21 NOTE — Progress Notes (Signed)
Family Meeting Note  Advance Directive:yes  Today a meeting took place with the Patient and family members at bedside     The following clinical team members were present during this meeting:MD  The following were discussed:Patient's diagnosis: Acute abdominal pain, dehydration, severe constipation, history of H. pylori gastritis, GERD hyperlipidemia, dizziness, treatment plan of care in detail discussed in detail with the patient and family members at bedside.  They all verbalized understanding of the plan.    Patient's progosis: > 12 months and Goals for treatment: Full Code healthcare power of attorney patient's sons Jonni Sanger and Lucianne Lei  Additional follow-up to be provided: Hospitalist and outpatient gastroenterology  Time spent during discussion:17 MIN  Nicholes Mango, MD

## 2018-04-21 NOTE — H&P (Signed)
[facility] [place] NAME: Amanda Meyer    MR#:  [id number]  DATE OF BIRTH:  10-11-1932  DATE OF ADMISSION:  04/20/2018  PRIMARY CARE PHYSICIAN: Kirk Ruths, MD   REQUESTING/REFERRING PHYSICIAN:   CHIEF COMPLAINT:   Chief Complaint  Patient presents with  . Abdominal Pain  . Emesis    HISTORY OF PRESENT ILLNESS: Amanda Meyer  is a 84 y.o. female with a known history of chronic constipation, hyperlipidemia and osteoarthritis. Patient was brought to emergency room for acute onset of severe epigastric pain associated with nausea and vomiting, started suddenly, in the evening, around 6 PM.  Patient denies eating anything unusual, no sick contacts.  No fever/chills, no diarrhea, no bleeding.  Her symptoms improved in the emergency room with IV morphine and Zofran.  She denies having similar episodes in the past. Most recent EGD and colonoscopy test were done approximately 6 months ago, with unremarkable results. Blood test done emergency room, including CBC and BMP are notable for elevated BUN at 24 and elevated creatinine level of 1.05. Abdominal x-ray shows large amount of stool throughout the colon suggesting constipation. Is admitted for further evaluation and treatment.   PAST MEDICAL HISTORY:   Past Medical History:  Diagnosis Date  . Drug induced constipation   . History of colon polyps   . Hypercholesterolemia   . OA (osteoarthritis)     PAST SURGICAL HISTORY:  Past Surgical History:  Procedure Laterality Date  . COLONOSCOPY WITH PROPOFOL N/A 10/30/2017   Procedure: COLONOSCOPY WITH PROPOFOL;  Surgeon: Toledo, Benay Pike, MD;  Location: [facility] ENDOSCOPY;  Service: Gastroenterology;  Laterality: N/A;  . ESOPHAGOGASTRODUODENOSCOPY (EGD) WITH PROPOFOL N/A 10/30/2017   Procedure: ESOPHAGOGASTRODUODENOSCOPY (EGD) WITH PROPOFOL;  Surgeon: Toledo, Benay Pike, MD;  Location: [facility] ENDOSCOPY;  Service: Gastroenterology;   Laterality: N/A;  . FRACTURE SURGERY    . right wrist surgery  1990   broken wrist repair    SOCIAL HISTORY:  Social History   Tobacco Use  . Smoking status: Former Smoker    Packs/day: 0.75    Years: 8.00    Pack years: 6.00    Types: Cigarettes    Last attempt to quit: 05/29/1960    Years since quitting: 57.9  . Smokeless tobacco: Never Used  Substance Use Topics  . Alcohol use: No    Alcohol/week: 0.0 standard drinks    FAMILY HISTORY:  Family History  Problem Relation Age of Onset  . Bladder Cancer Brother   . Colon cancer Father   . Heart disease Neg Hx   . Stroke Neg Hx   . Diabetes Neg Hx        type 2  . Peripheral vascular disease Neg Hx     DRUG ALLERGIES:  Allergies  Allergen Reactions  . Hydromorphone Other (See Comments)  . Pravastatin Sodium     Fatigue  . Sulfa Antibiotics Rash    REVIEW OF SYSTEMS:   CONSTITUTIONAL: No fever, fatigue or weakness.  EYES: No changes in vision.  EARS, NOSE, AND THROAT: No tinnitus or ear pain.  RESPIRATORY: No cough, shortness of breath, wheezing or hemoptysis.  CARDIOVASCULAR: No chest pain, orthopnea, edema.  GASTROINTESTINAL: Positive for epigastric pain, nausea, vomiting and constipation.   GENITOURINARY: No dysuria, hematuria.  ENDOCRINE: No polyuria, nocturia. HEMATOLOGY: No bleeding. SKIN: No rash or lesion. MUSCULOSKELETAL: No joint pain at this time.   NEUROLOGIC: No focal weakness.  PSYCHIATRY: No anxiety or depression.  MEDICATIONS AT HOME:  Prior to Admission medications   Medication Sig Start Date End Date Taking? Authorizing Provider  aspirin 81 MG tablet Take 1 tablet by mouth daily.   Yes [provider]  calcium carbonate (OSCAL) 1500 (600 CA) MG TABS tablet Take 1 tablet by mouth daily.   Yes [provider]  cholecalciferol (VITAMIN D) 1000 UNITS tablet Take 1 tablet by mouth daily. 10/17/11  Yes [provider]  magnesium hydroxide (MILK OF MAGNESIA) 400 MG/5ML  suspension Take 10 mLs by mouth daily as needed for mild constipation.   Yes [provider]  Multiple Vitamins-Minerals (MULTIVITAMIN ADULT PO) Take 1 tablet by mouth daily.   Yes [provider]  omeprazole (PRILOSEC) 20 MG capsule Take 1 capsule (20 mg total) by mouth daily. 06/28/15  Yes Birdie Sons, MD  Probiotic Product (PEARLS IC PO) Take 1 tablet by mouth daily.   Yes [provider]  raloxifene (EVISTA) 60 MG tablet TAKE ONE TABLET EVERY DAY 01/21/18  Yes Birdie Sons, MD      PHYSICAL EXAMINATION:   VITAL SIGNS: Blood pressure (!) 147/72, pulse 88, temperature 99.3 F (37.4 C), temperature source Oral, resp. rate 18, height 5\' 9"  (1.753 m), weight 65.7 kg, SpO2 97 %.  GENERAL:  84 y.o.-year-old patient lying in the bed with no acute distress.  EYES: Pupils equal, round, reactive to light and accommodation. No scleral icterus. Extraocular muscles intact.  HEENT: Head atraumatic, normocephalic. Oropharynx and nasopharynx clear.  NECK:  Supple, no jugular venous distention. No thyroid enlargement, no tenderness.  LUNGS: Normal breath sounds bilaterally, no wheezing, rales,rhonchi or crepitation. No use of accessory muscles of respiration.  CARDIOVASCULAR: S1, S2 normal. No S3/S4.  ABDOMEN: There is mild tenderness with palpation in the epigastric area, this is much improved after morphine IV.  Otherwise, the abdomen is soft, nondistended. Bowel sounds present. No organomegaly or mass.  EXTREMITIES: No pedal edema, cyanosis, or clubbing.  NEUROLOGIC: Cranial nerves II through XII are intact. Muscle strength 5/5 in all extremities. Sensation intact.   PSYCHIATRIC: The patient is alert and oriented x 3.  SKIN: No obvious rash, lesion, or ulcer.   LABORATORY PANEL:   CBC Recent Labs  Lab 04/20/18 2038  WBC 8.6  HGB 13.6  HCT 41.7  PLT 175  MCV 95.0  MCH 31.0  MCHC 32.6  RDW 13.2    ------------------------------------------------------------------------------------------------------------------  Chemistries  Recent Labs  Lab 04/20/18 2038  NA 140  K 4.0  CL 106  CO2 22  GLUCOSE 127*  BUN 24*  CREATININE 1.05*  CALCIUM 8.8*  AST 29  ALT 16  ALKPHOS 56  BILITOT 0.7   ------------------------------------------------------------------------------------------------------------------ estimated creatinine clearance is 42.1 mL/min (A) (by C-G formula based on SCr of 1.05 mg/dL (H)). ------------------------------------------------------------------------------------------------------------------ No results for input(s): TSH, T4TOTAL, T3FREE, THYROIDAB in the last 72 hours.  Invalid input(s): FREET3   Coagulation profile No results for input(s): INR, PROTIME in the last 168 hours. ------------------------------------------------------------------------------------------------------------------- No results for input(s): DDIMER in the last 72 hours. -------------------------------------------------------------------------------------------------------------------  Cardiac Enzymes No results for input(s): CKMB, TROPONINI, MYOGLOBIN in the last 168 hours.  Invalid input(s): CK ------------------------------------------------------------------------------------------------------------------ Invalid input(s): POCBNP  ---------------------------------------------------------------------------------------------------------------  Urinalysis    Component Value Date/Time   COLORURINE YELLOW (A) 07/05/2017 1806   APPEARANCEUR CLOUDY (A) 07/05/2017 1806   LABSPEC 1.019 07/05/2017 1806   PHURINE 8.0 07/05/2017 1806   GLUCOSEU NEGATIVE 07/05/2017 1806   HGBUR NEGATIVE 07/05/2017 1806   BILIRUBINUR NEGATIVE 07/05/2017  1806   KETONESUR 20 (A) 07/05/2017 1806   PROTEINUR 30 (A) 07/05/2017 1806   NITRITE NEGATIVE 07/05/2017 1806   LEUKOCYTESUR NEGATIVE  07/05/2017 1806     RADIOLOGY: Dg Abdomen 1 View  Result Date: 04/20/2018 CLINICAL DATA:  Epigastric pain for 1 hour, vomiting. EXAM: ABDOMEN - 1 VIEW COMPARISON:  None. FINDINGS: There appears to be a large amount of stool throughout the colon. No dilated small bowel loops appreciated. No evidence of soft tissue mass or abnormal fluid collection. No evidence of free intraperitoneal air. No evidence of renal or ureteral calculi. Degenerative changes again noted throughout the scoliotic lumbar spine, mild to moderate in degree. No acute or suspicious osseous finding. Status post RIGHT hip arthroplasty. IMPRESSION: 1. Large amount of stool throughout the colon suggesting constipation. 2. Otherwise unremarkable exam.  No evidence of bowel obstruction. Electronically Signed   By: Franki Cabot M.D.   On: 04/20/2018 23:42    EKG: Orders placed or performed during the hospital encounter of 07/05/17  . ED EKG  . ED EKG  . EKG 12-Lead  . EKG 12-Lead  . EKG    IMPRESSION AND PLAN:  1.  Severe epigastric pain, of unclear etiology, suspect acute gastroenteritis.  We will continue supportive measures with pain medication, antinausea medication and IV fluids.  Will check right upper quadrant ultrasound to evaluate the gallbladder as possible cause for patient's symptoms.  GI is consulted for further evaluation and treatment. 2.  Chronic constipation.  We will treat patient with stool softeners. 3.  ARF, likely prerenal, from dehydration due to nausea and vomiting.  We will start treatment with IV fluids and monitor kidney function closely, avoid nephrotoxic medications.  All the records are reviewed and case discussed with ED provider. Management plans discussed with the patient, family and they are in agreement.  CODE STATUS: Full    Code Status Orders  (From admission, onward)         Start     Ordered   04/21/18 0156  Full code  Continuous     04/21/18 0155        Code Status History     This patient has a current code status but no historical code status.    Advance Directive Documentation     Most Recent Value  Type of Advance Directive  Healthcare Power of Attorney  Pre-existing out of facility DNR order (yellow form or pink MOST form)  -  "MOST" Form in Place?  -       TOTAL TIME TAKING CARE OF THIS PATIENT: 50 minutes.    Amelia Jo M.D on 04/21/2018 at 4:39 AM  Between 7am to 6pm - Pager - [phone]  After 6pm go to [email or web address] - password [email or web address] [facility] [place] [facility]  [phone]  CC: Primary care physician; Kirk Ruths, MD

## 2018-04-21 NOTE — Discharge Instructions (Signed)
FOLLOW With primary care physician in 3 to 4 days Follow-up with gastroenterology Dr. Alice Reichert in a week

## 2018-04-21 NOTE — Progress Notes (Signed)
04/21/2018 4:39 PM  Amanda Meyer to be D/C'd Home per MD order.  Discussed prescriptions and follow up appointments with the patient. Prescriptions given to patient, medication list explained in detail. Pt verbalized understanding.  Allergies as of 04/21/2018      Reactions   Hydromorphone Other (See Comments)   Pravastatin Sodium    Fatigue   Sulfa Antibiotics Rash      Medication List    TAKE these medications   acetaminophen 325 MG tablet Commonly known as:  TYLENOL Take 2 tablets (650 mg total) by mouth every 6 (six) hours as needed for mild pain (or Fever >/= 101).   aspirin 81 MG tablet Take 1 tablet by mouth daily.   bisacodyl 5 MG EC tablet Commonly known as:  DULCOLAX Take 1 tablet (5 mg total) by mouth daily as needed for moderate constipation.   calcium carbonate 1500 (600 Ca) MG Tabs tablet Commonly known as:  OSCAL Take 1 tablet by mouth daily.   cholecalciferol 1000 units tablet Commonly known as:  VITAMIN D Take 1 tablet by mouth daily.   docusate sodium 100 MG capsule Commonly known as:  COLACE Take 1 capsule (100 mg total) by mouth daily.   magnesium hydroxide 400 MG/5ML suspension Commonly known as:  MILK OF MAGNESIA Take 10 mLs by mouth daily as needed for mild constipation.   MULTIVITAMIN ADULT PO Take 1 tablet by mouth daily.   omeprazole 20 MG capsule Commonly known as:  PRILOSEC Take 1 capsule (20 mg total) by mouth daily.   PEARLS IC PO Take 1 tablet by mouth daily.   polyethylene glycol packet Commonly known as:  MIRALAX / GLYCOLAX Take 17 g by mouth daily.   raloxifene 60 MG tablet Commonly known as:  EVISTA TAKE ONE TABLET EVERY DAY       Vitals:   04/21/18 0130 04/21/18 0204  BP: (!) 146/69 (!) 147/72  Pulse: 94 88  Resp:  18  Temp:  99.3 F (37.4 C)  SpO2: 94% 97%    Skin clean, dry and intact without evidence of skin break down, no evidence of skin tears noted. IV catheter discontinued intact. Site without signs  and symptoms of complications. Dressing and pressure applied. Pt denies pain at this time. No complaints noted.  An After Visit Summary was printed and given to the patient. Patient escorted via [place], and D/C home via private auto.  Dola Argyle

## 2018-04-21 NOTE — Discharge Summary (Signed)
[facility] [place] NAME: Amanda Meyer    MR#:  [id number]  DATE OF BIRTH:  February 10, 1933  DATE OF ADMISSION:  04/20/2018 ADMITTING PHYSICIAN: Amelia Jo, MD  DATE OF DISCHARGE: 04/21/18  PRIMARY CARE PHYSICIAN: Kirk Ruths, MD    ADMISSION DIAGNOSIS:  Dizziness [R42] Acute gastritis without hemorrhage, unspecified gastritis type [K29.00] Non-intractable vomiting with nausea, unspecified vomiting type [R11.2]  DISCHARGE DIAGNOSIS:  Severe constipation Abdominal pain resolved Dizziness resolved  SECONDARY DIAGNOSIS:   Past Medical History:  Diagnosis Date  . Drug induced constipation   . History of colon polyps   . Hypercholesterolemia   . OA (osteoarthritis)     HOSPITAL COURSE:  HISTORY OF PRESENT ILLNESS: Amanda Meyer  is a 84 y.o. female with a known history of chronic constipation, hyperlipidemia and osteoarthritis. Patient was brought to emergency room for acute onset of severe epigastric pain associated with nausea and vomiting, started suddenly, in the evening, around 6 PM.  Patient denies eating anything unusual, no sick contacts.  No fever/chills, no diarrhea, no bleeding.  Her symptoms improved in the emergency room with IV morphine and Zofran.  She denies having similar episodes in the past. Most recent EGD and colonoscopy test were done approximately 6 months ago, with unremarkable results. Blood test done emergency room, including CBC and BMP are notable for elevated BUN at 24 and elevated creatinine level of 1.05. Abdominal x-ray shows large amount of stool throughout the colon suggesting constipation. Is admitted for further evaluation and treatment.  1.  Severe epigastric pain secondary to constipation Patient had a large bowel movement after soapsuds edema and abdominal pain improved dizziness resolved Seen by gastroenterology recommending outpatient capsule study Dr. Ricky Stabs office as patient  was seen by them in the past Most recent EGD and colonoscopy test were done approximately 6 months ago, with unremarkable results. Abdominal x-ray shows large amount of stool throughout the colon suggesting constipation.  Patient will be discharged with good bowel regimen MiraLAX, Senokot daily basis GI is recommending outpatient small bowel capsule study to complete evaluation of her iron deficiency anemia as an outpatient at [facility] Recommending to check for the eradication of H. pylori off PPI as an outpatient  Normal right upper quadrant ultrasound  2.  Chronic constipation.  We will treat patient with stool softeners.  3.  ARF,  prerenal, from dehydration due to nausea and vomiting.    Nausea vomiting resolved.  Status post IV fluids and renal function is back to normal.  avoid nephrotoxic medications. Creatinine 0.91 and GFR greater than 60  4.  Generalized weakness with dizziness completely resolved after bowel movement Patient ambulated in the hallway with RN assistance.  No PT needs identified as reported by RN.  Discharge patient home to family care.  Plan of care discussed with patient's daughter and 3 sons and other significant family members at bedside.  They all verbalized understanding of the plan  DISCHARGE CONDITIONS:   Stable  CONSULTS OBTAINED:     PROCEDURES none  DRUG ALLERGIES:   Allergies  Allergen Reactions  . Hydromorphone Other (See Comments)  . Pravastatin Sodium     Fatigue  . Sulfa Antibiotics Rash    DISCHARGE MEDICATIONS:   Allergies as of 04/21/2018      Reactions   Hydromorphone Other (See Comments)   Pravastatin Sodium    Fatigue   Sulfa Antibiotics Rash      Medication List  TAKE these medications   acetaminophen 325 MG tablet Commonly known as:  TYLENOL Take 2 tablets (650 mg total) by mouth every 6 (six) hours as needed for mild pain (or Fever >/= 101).   aspirin 81 MG tablet Take 1 tablet by mouth  daily.   bisacodyl 5 MG EC tablet Commonly known as:  DULCOLAX Take 1 tablet (5 mg total) by mouth daily as needed for moderate constipation.   calcium carbonate 1500 (600 Ca) MG Tabs tablet Commonly known as:  OSCAL Take 1 tablet by mouth daily.   cholecalciferol 1000 units tablet Commonly known as:  VITAMIN D Take 1 tablet by mouth daily.   docusate sodium 100 MG capsule Commonly known as:  COLACE Take 1 capsule (100 mg total) by mouth daily.   magnesium hydroxide 400 MG/5ML suspension Commonly known as:  MILK OF MAGNESIA Take 10 mLs by mouth daily as needed for mild constipation.   MULTIVITAMIN ADULT PO Take 1 tablet by mouth daily.   omeprazole 20 MG capsule Commonly known as:  PRILOSEC Take 1 capsule (20 mg total) by mouth daily.   PEARLS IC PO Take 1 tablet by mouth daily.   polyethylene glycol packet Commonly known as:  MIRALAX / GLYCOLAX Take 17 g by mouth daily.   raloxifene 60 MG tablet Commonly known as:  EVISTA TAKE ONE TABLET EVERY DAY        DISCHARGE INSTRUCTIONS:   With primary care physician in 3 to 4 days Follow-up with gastroenterology Dr. Alice Reichert in a week  DIET:  Fiber rich food low-fat diet  DISCHARGE CONDITION:  Fair  ACTIVITY:  Activity as tolerated  OXYGEN:  Home Oxygen: No.   Oxygen Delivery: room air  DISCHARGE LOCATION:  home   If you experience worsening of your admission symptoms, develop shortness of breath, life threatening emergency, suicidal or homicidal thoughts you must seek medical attention immediately by calling 911 or calling your MD immediately  if symptoms less severe.  You Must read complete instructions/literature along with all the possible adverse reactions/side effects for all the Medicines you take and that have been prescribed to you. Take any new Medicines after you have completely understood and accpet all the possible adverse reactions/side effects.   Please note  You were cared for by a  hospitalist during your hospital stay. If you have any questions about your discharge medications or the care you received while you were in the hospital after you are discharged, you can call the unit and asked to speak with the hospitalist on call if the hospitalist that took care of you is not available. Once you are discharged, your primary care physician will handle any further medical issues. Please note that NO REFILLS for any discharge medications will be authorized once you are discharged, as it is imperative that you return to your primary care physician (or establish a relationship with a primary care physician if you do not have one) for your aftercare needs so that they can reassess your need for medications and monitor your lab values.     Today  Chief Complaint  Patient presents with  . Abdominal Pain  . Emesis   Patient had a large bowel movement after soapsuds enema and feeling much better.  Abdominal pain improved.  Tolerated diet.  Ambulated in the hallway and wants to go home  ROS:  CONSTITUTIONAL: Denies fevers, chills. Denies any fatigue, weakness.  EYES: Denies blurry vision, double vision, eye pain. EARS, NOSE, THROAT: Denies tinnitus,  ear pain, hearing loss. RESPIRATORY: Denies cough, wheeze, shortness of breath.  CARDIOVASCULAR: Denies chest pain, palpitations, edema.  GASTROINTESTINAL: Denies nausea, vomiting, diarrhea, abdominal pain. Denies bright red blood per rectum. GENITOURINARY: Denies dysuria, hematuria. ENDOCRINE: Denies nocturia or thyroid problems. HEMATOLOGIC AND LYMPHATIC: Denies easy bruising or bleeding. SKIN: Denies rash or lesion. MUSCULOSKELETAL: Denies pain in neck, back, shoulder, knees, hips or arthritic symptoms.  NEUROLOGIC: Denies paralysis, paresthesias.  PSYCHIATRIC: Denies anxiety or depressive symptoms.   VITAL SIGNS:  Blood pressure (!) 147/72, pulse 88, temperature 99.3 F (37.4 C), temperature source Oral, resp. rate 18, height  5\' 9"  (1.753 m), weight 65.7 kg, SpO2 97 %.  I/O:    Intake/Output Summary (Last 24 hours) at 04/21/2018 1458 Last data filed at 04/21/2018 1148 Gross per 24 hour  Intake 2678.32 ml  Output 150 ml  Net 2528.32 ml    PHYSICAL EXAMINATION:  GENERAL:  84 y.o.-year-old patient lying in the bed with no acute distress.  EYES: Pupils equal, round, reactive to light and accommodation. No scleral icterus. Extraocular muscles intact.  HEENT: Head atraumatic, normocephalic. Oropharynx and nasopharynx clear.  NECK:  Supple, no jugular venous distention. No thyroid enlargement, no tenderness.  LUNGS: Normal breath sounds bilaterally, no wheezing, rales,rhonchi or crepitation. No use of accessory muscles of respiration.  CARDIOVASCULAR: S1, S2 normal. No murmurs, rubs, or gallops.  ABDOMEN: Soft, non-tender, non-distended. Bowel sounds present. No organomegaly or mass.  EXTREMITIES: No pedal edema, cyanosis, or clubbing.  NEUROLOGIC: Cranial nerves II through XII are intact. Muscle strength 5/5 in all extremities. Sensation intact. Gait not checked.  PSYCHIATRIC: The patient is alert and oriented x 3.  SKIN: No obvious rash, lesion, or ulcer.   DATA REVIEW:   CBC Recent Labs  Lab 04/21/18 0619  WBC 6.9  HGB 12.0  HCT 37.5  PLT 137*    Chemistries  Recent Labs  Lab 04/20/18 2038 04/21/18 0619  NA 140 141  K 4.0 3.7  CL 106 112*  CO2 22 24  GLUCOSE 127* 122*  BUN 24* 21  CREATININE 1.05* 0.91  CALCIUM 8.8* 7.6*  AST 29  --   ALT 16  --   ALKPHOS 56  --   BILITOT 0.7  --     Cardiac Enzymes No results for input(s): TROPONINI in the last 168 hours.  Microbiology Results  No results found for this or any previous visit.  RADIOLOGY:  Dg Abdomen 1 View  Result Date: 04/20/2018 CLINICAL DATA:  Epigastric pain for 1 hour, vomiting. EXAM: ABDOMEN - 1 VIEW COMPARISON:  None. FINDINGS: There appears to be a large amount of stool throughout the colon. No dilated small bowel  loops appreciated. No evidence of soft tissue mass or abnormal fluid collection. No evidence of free intraperitoneal air. No evidence of renal or ureteral calculi. Degenerative changes again noted throughout the scoliotic lumbar spine, mild to moderate in degree. No acute or suspicious osseous finding. Status post RIGHT hip arthroplasty. IMPRESSION: 1. Large amount of stool throughout the colon suggesting constipation. 2. Otherwise unremarkable exam.  No evidence of bowel obstruction. Electronically Signed   By: Franki Cabot M.D.   On: 04/20/2018 23:42   US Abdomen Limited Ruq  Result Date: 04/21/2018 CLINICAL DATA:  84 year old female with abdominal pain. EXAM: ULTRASOUND ABDOMEN LIMITED RIGHT UPPER QUADRANT COMPARISON:  None. FINDINGS: Gallbladder: The gallbladder is unremarkable. There is no evidence of cholelithiasis or acute cholecystitis. Common bile duct: Diameter: 8 mm.  No intrahepatic or extrahepatic biliary dilatation.  Liver: No focal lesion identified. Within normal limits in parenchymal echogenicity. Portal vein is patent on color Doppler imaging with normal direction of blood flow towards the liver. IMPRESSION: Unremarkable RIGHT UPPER quadrant abdominal ultrasound. Normal gallbladder. Electronically Signed   By: Margarette Canada M.D.   On: 04/21/2018 09:05    EKG:   Orders placed or performed during the hospital encounter of 07/05/17  . ED EKG  . ED EKG  . EKG 12-Lead  . EKG 12-Lead  . EKG      Management plans discussed with the patient, family and they are in agreement.  CODE STATUS:     Code Status Orders  (From admission, onward)         Start     Ordered   04/21/18 0156  Full code  Continuous     04/21/18 0155        Code Status History    This patient has a current code status but no historical code status.    Advance Directive Documentation     Most Recent Value  Type of Advance Directive  Healthcare Power of Attorney  Pre-existing out of facility DNR order  (yellow form or pink MOST form)  -  "MOST" Form in Place?  -      TOTAL TIME TAKING CARE OF THIS PATIENT: 43  minutes.   Note: This dictation was prepared with Dragon dictation along with smaller phrase technology. Any transcriptional errors that result from this process are unintentional.   @[facility] @  on 04/21/2018 at 2:58 PM  Between 7am to 6pm - Pager - [phone]  After 6pm go to [email or web address] - password [email or web address] [facility]  [facility]  Office  [phone]  CC: Primary care physician; Kirk Ruths, MD

## 2018-04-21 NOTE — ED Notes (Signed)
Ann RN, aware of bed assigned 

## 2018-04-21 NOTE — Consult Note (Signed)
Patient discharged before being seen

## 2018-04-26 ENCOUNTER — Telehealth: Payer: Self-pay

## 2018-04-26 NOTE — Telephone Encounter (Signed)
Flagged on EMMI report for not knowing who to call about changes in condition.  First attempt to reach patient made, however unable to reach.  Left voicemail encouraging callback for questions or issues.  Will attempt at later time.

## 2018-04-29 NOTE — Telephone Encounter (Signed)
Second attempt to reach made, however unable to reach.  Left another voicemail encouraging callback for any questions or concerns.  No further attempts at this time.

## 2018-12-19 DIAGNOSIS — C4492 Squamous cell carcinoma of skin, unspecified: Secondary | ICD-10-CM

## 2018-12-19 HISTORY — DX: Squamous cell carcinoma of skin, unspecified: C44.92

## 2018-12-24 DIAGNOSIS — Z85828 Personal history of other malignant neoplasm of skin: Secondary | ICD-10-CM

## 2018-12-24 HISTORY — DX: Personal history of other malignant neoplasm of skin: Z85.828

## 2019-06-24 IMAGING — US US ABDOMEN LIMITED
1 series · 14 of 25 positions shown · non-contrast
Comparison: None.

CLINICAL DATA: 85-year-old female with abdominal pain.

EXAM:
ULTRASOUND ABDOMEN LIMITED RIGHT UPPER QUADRANT

[Series 1: us abdomen limited · 0.22mm/px · 14 of 38 slices shown]
[im 1/38]
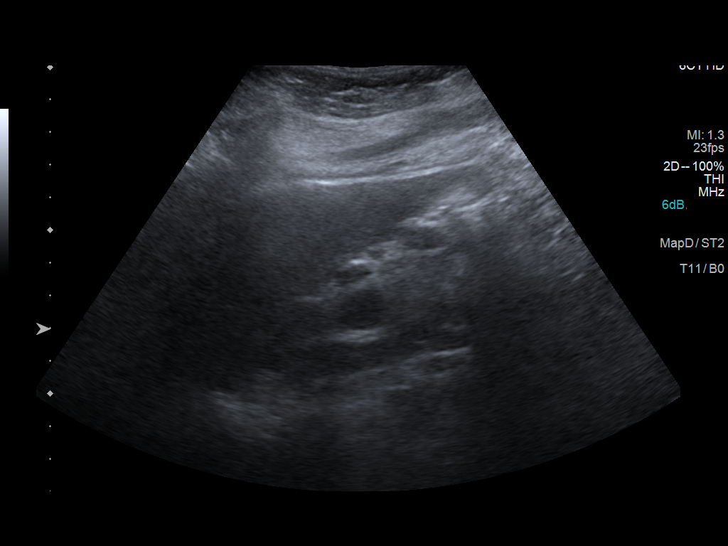
[im 4/38]
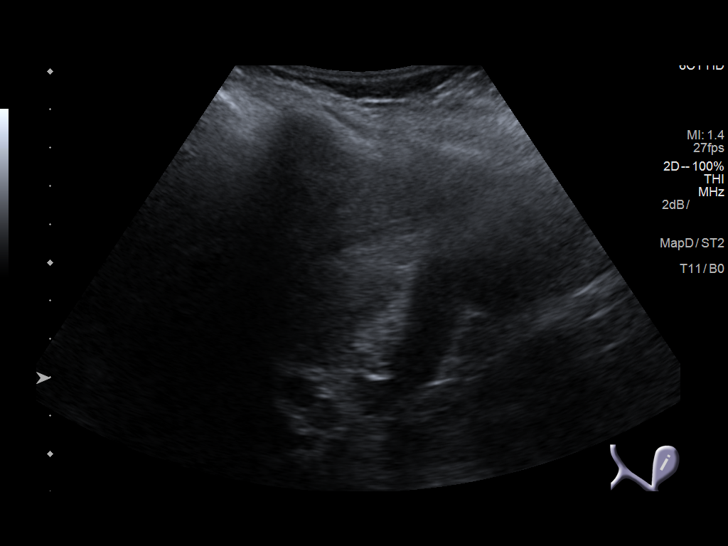
[im 7/38]
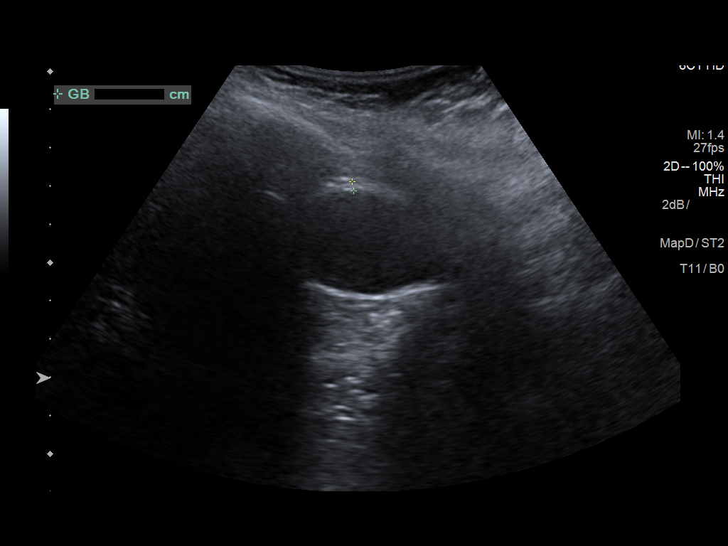
[im 10/38]
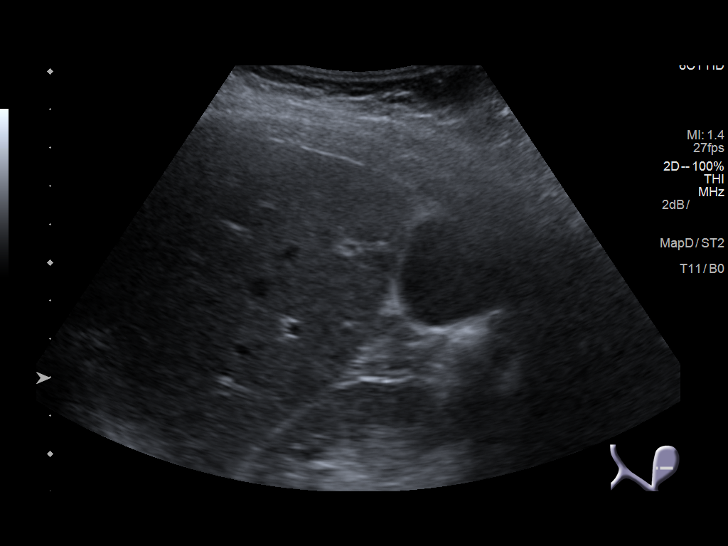
[im 13/38]
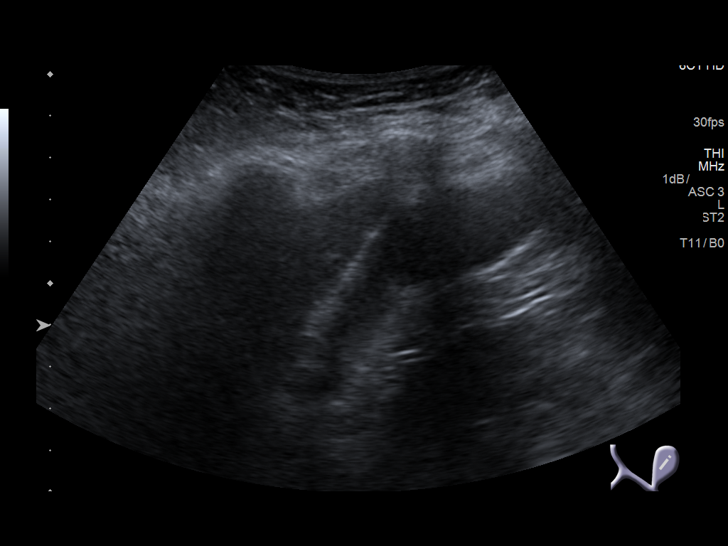
[im 14/38]
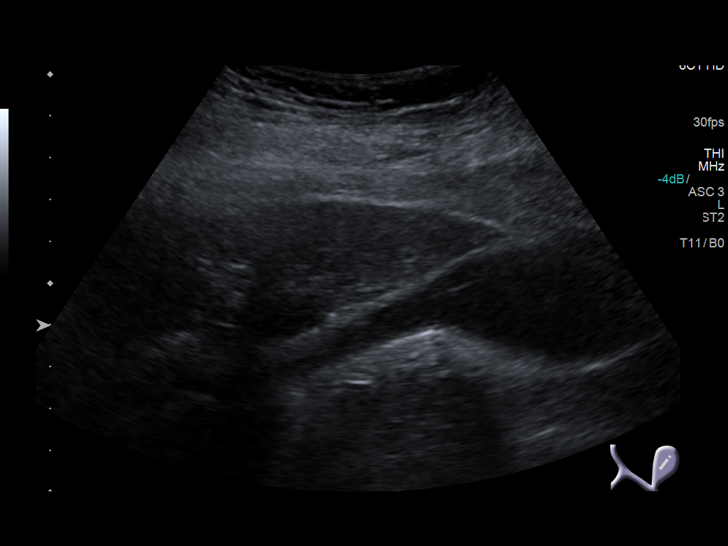
[im 17/38]
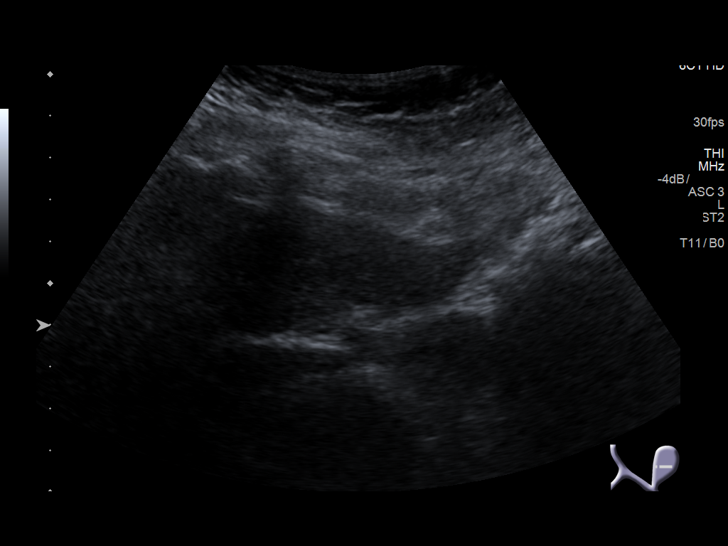
[im 21/38]
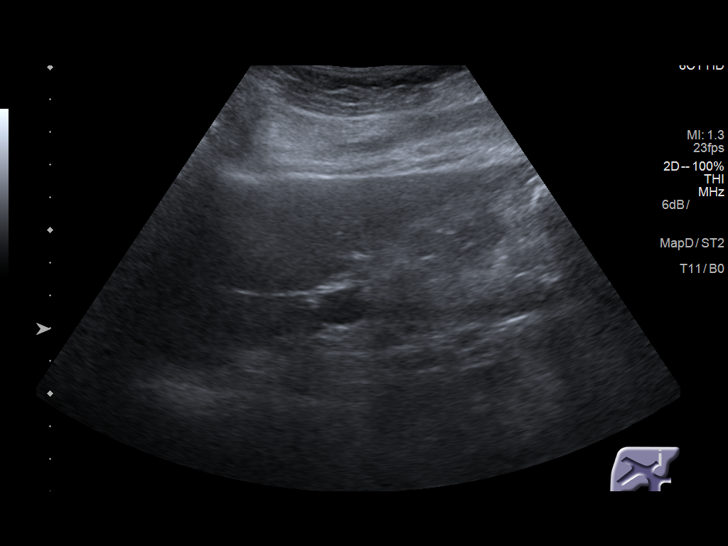
[im 24/38]
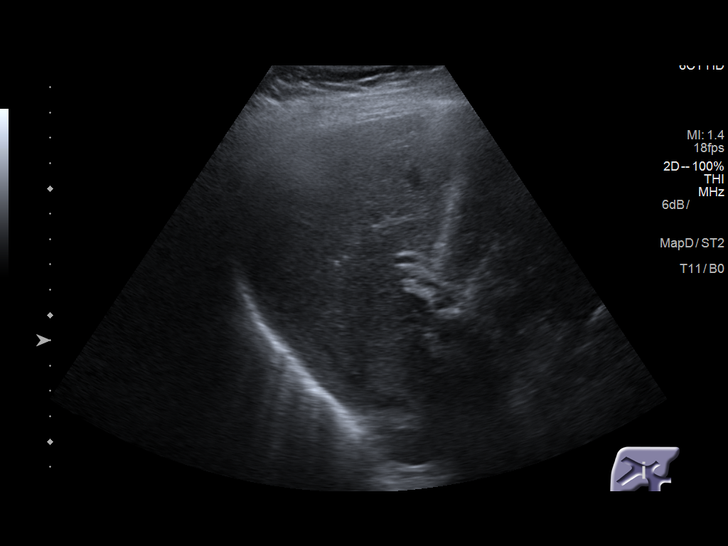
[im 25/38]
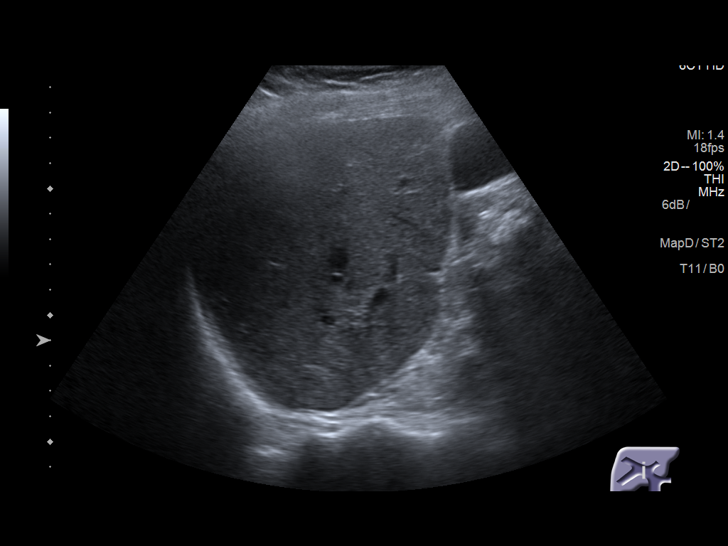
[im 28/38]
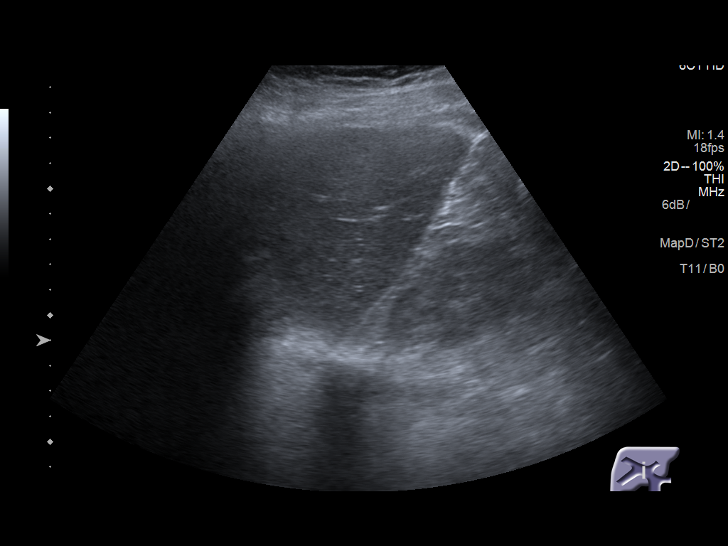
[im 31/38]
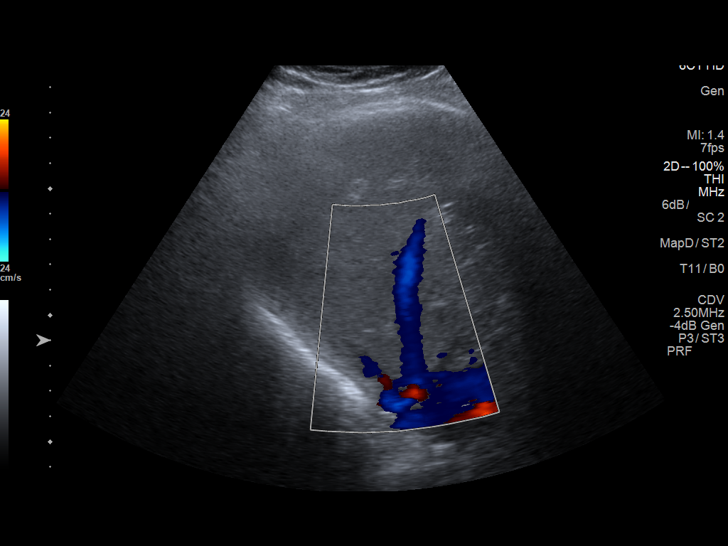
[im 34/38]
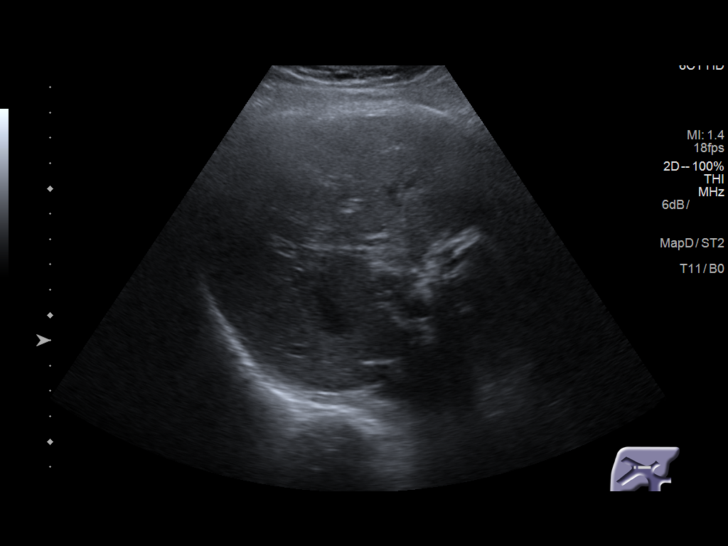
[im 38/38]
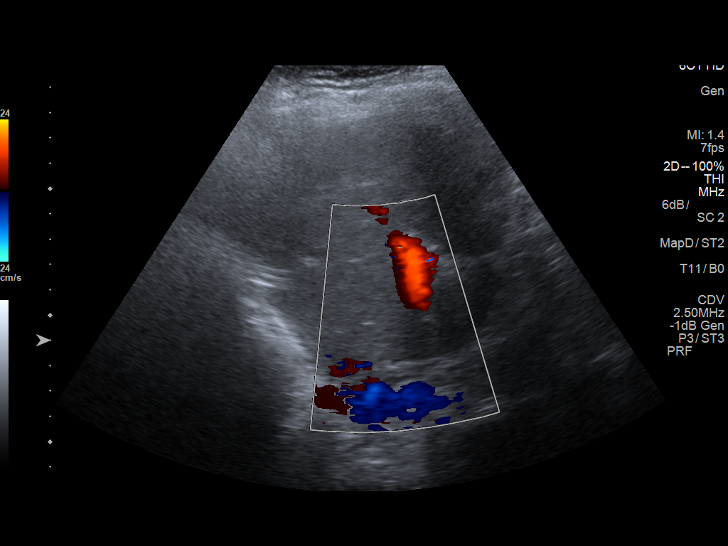

[14 of 25 positions shown; findings below may reference images not displayed]

FINDINGS: Gallbladder:

The gallbladder is unremarkable. There is no evidence of
cholelithiasis or acute cholecystitis.

Common bile duct:

Diameter: 8 mm.  No intrahepatic or extrahepatic biliary dilatation.

Liver:

No focal lesion identified. Within normal limits in parenchymal
echogenicity. Portal vein is patent on color Doppler imaging with
normal direction of blood flow towards the liver.
IMPRESSION: Unremarkable RIGHT UPPER quadrant abdominal ultrasound. Normal
gallbladder.

## 2019-09-08 ENCOUNTER — Encounter: Payer: Self-pay | Admitting: Dermatology

## 2019-09-08 ENCOUNTER — Other Ambulatory Visit: Payer: Self-pay

## 2019-09-08 ENCOUNTER — Ambulatory Visit: Payer: Medicare Other | Admitting: Dermatology

## 2019-09-08 DIAGNOSIS — D1800 Hemangioma unspecified site: Secondary | ICD-10-CM

## 2019-09-08 DIAGNOSIS — Z85828 Personal history of other malignant neoplasm of skin: Secondary | ICD-10-CM

## 2019-09-08 DIAGNOSIS — L820 Inflamed seborrheic keratosis: Secondary | ICD-10-CM | POA: Diagnosis not present

## 2019-09-08 DIAGNOSIS — L821 Other seborrheic keratosis: Secondary | ICD-10-CM

## 2019-09-08 DIAGNOSIS — L814 Other melanin hyperpigmentation: Secondary | ICD-10-CM

## 2019-09-08 DIAGNOSIS — Z1283 Encounter for screening for malignant neoplasm of skin: Secondary | ICD-10-CM

## 2019-09-08 DIAGNOSIS — L570 Actinic keratosis: Secondary | ICD-10-CM

## 2019-09-08 DIAGNOSIS — D229 Melanocytic nevi, unspecified: Secondary | ICD-10-CM

## 2019-09-08 DIAGNOSIS — L578 Other skin changes due to chronic exposure to nonionizing radiation: Secondary | ICD-10-CM

## 2019-09-08 DIAGNOSIS — I8393 Asymptomatic varicose veins of bilateral lower extremities: Secondary | ICD-10-CM

## 2019-09-08 NOTE — Progress Notes (Signed)
Follow-Up Visit   Subjective  Amanda Meyer is a 86 y.o. female who presents for the following: Annual Exam (Hx of SCC on the mid to distal R lower leg. Patient has also noticed a lesion between her breast on her chest that she is concerned about. ). Patient presents for skin cancer screening and total-body skin exam.  The following portions of the chart were reviewed this encounter and updated as appropriate: Tobacco  Allergies  Meds  Problems  Med Hx  Surg Hx  Fam Hx     Review of Systems: No other skin or systemic complaints.  Objective  Well appearing patient in no apparent distress; mood and affect are within normal limits.  A full examination was performed including scalp, head, eyes, ears, nose, lips, neck, chest, axillae, abdomen, back, buttocks, bilateral upper extremities, bilateral lower extremities, hands, feet, fingers, toes, fingernails, and toenails. All findings within normal limits unless otherwise noted below.  Objective  R nose x 1: Erythematous thin papules/macules with gritty scale.   Objective  chest x 1: Erythematous keratotic or waxy stuck-on papule or plaque.   Assessment & Plan    Seborrheic Keratoses - Stuck-on, waxy, tan-brown papules and plaques  - Discussed benign etiology and prognosis. - Observe - Call for any changes  Lentigines - Scattered tan macules - Discussed due to sun exposure - Benign, observe - Call for any changes  Hemangiomas - Red papules - Discussed benign nature - Observe - Call for any changes  Melanocytic Nevi - Tan-brown and/or pink-flesh-colored symmetric macules and papules - Benign appearing on exam today - Observation - Call clinic for new or changing moles - Recommend daily use of broad spectrum spf 30+ sunscreen to sun-exposed areas.   Actinic Damage - diffuse scaly erythematous macules with underlying dyspigmentation - Recommend daily broad spectrum sunscreen SPF 30+ to sun-exposed areas, reapply  every 2 hours as needed.  - Call for new or changing lesions.  History of Squamous Cell Carcinoma of the Skin - No evidence of recurrence today - No lymphadenopathy - Recommend regular full body skin exams - Recommend daily broad spectrum sunscreen SPF 30+ to sun-exposed areas, reapply every 2 hours as needed.  - Call if any new or changing lesions are noted between office visits  Varicose Veins - Dilated blue, purple or red veins at the lower extremities - Reassured - These can be treated by sclerotherapy (a procedure to inject a medicine into the veins to make them disappear) if desired, but the treatment is not covered by insurance  AK (actinic keratosis) R nose x 1  Destruction of lesion - R nose x 1 Complexity: simple   Destruction method: cryotherapy   Informed consent: discussed and consent obtained   Timeout:  patient name, date of birth, surgical site, and procedure verified Lesion destroyed using liquid nitrogen: Yes   Region frozen until ice ball extended beyond lesion: Yes   Outcome: patient tolerated procedure well with no complications   Post-procedure details: wound care instructions given    Inflamed seborrheic keratosis chest x 1  Destruction of lesion - chest x 1 Complexity: simple   Destruction method: cryotherapy   Informed consent: discussed and consent obtained   Timeout:  patient name, date of birth, surgical site, and procedure verified Lesion destroyed using liquid nitrogen: Yes   Region frozen until ice ball extended beyond lesion: Yes   Outcome: patient tolerated procedure well with no complications   Post-procedure details: wound care instructions given  Return in about 1 year (around 09/07/2020) for TBSE.   Luther Redo, CMA, am acting as scribe for Sarina Ser, MD . Documentation: I have reviewed the above documentation for accuracy and completeness, and I agree with the above.  Sarina Ser, MD

## 2019-09-09 ENCOUNTER — Encounter: Payer: Self-pay | Admitting: Dermatology

## 2020-09-13 ENCOUNTER — Encounter: Payer: Medicare Other | Admitting: Dermatology

## 2020-11-08 ENCOUNTER — Other Ambulatory Visit: Payer: Self-pay

## 2020-11-08 ENCOUNTER — Ambulatory Visit (INDEPENDENT_AMBULATORY_CARE_PROVIDER_SITE_OTHER): Payer: Medicare Other | Admitting: Dermatology

## 2020-11-08 DIAGNOSIS — L814 Other melanin hyperpigmentation: Secondary | ICD-10-CM | POA: Diagnosis not present

## 2020-11-08 DIAGNOSIS — D1800 Hemangioma unspecified site: Secondary | ICD-10-CM

## 2020-11-08 DIAGNOSIS — L578 Other skin changes due to chronic exposure to nonionizing radiation: Secondary | ICD-10-CM

## 2020-11-08 DIAGNOSIS — Z1283 Encounter for screening for malignant neoplasm of skin: Secondary | ICD-10-CM | POA: Diagnosis not present

## 2020-11-08 DIAGNOSIS — L820 Inflamed seborrheic keratosis: Secondary | ICD-10-CM | POA: Diagnosis not present

## 2020-11-08 DIAGNOSIS — L821 Other seborrheic keratosis: Secondary | ICD-10-CM | POA: Diagnosis not present

## 2020-11-08 DIAGNOSIS — D229 Melanocytic nevi, unspecified: Secondary | ICD-10-CM

## 2020-11-08 NOTE — Patient Instructions (Signed)

## 2020-11-08 NOTE — Progress Notes (Signed)
   Follow-Up Visit   Subjective  Amanda Meyer is a 87 y.o. female who presents for the following: Annual Exam. The patient presents for Total-Body Skin Exam (TBSE) for skin cancer screening and mole check. Hx SCC med to distal R lower leg. Patient has noticed lesions on her back that are itchy.   The following portions of the chart were reviewed this encounter and updated as appropriate:   Tobacco  Allergies  Meds  Problems  Med Hx  Surg Hx  Fam Hx      Review of Systems:  No other skin or systemic complaints except as noted in HPI or Assessment and Plan.  Objective  Well appearing patient in no apparent distress; mood and affect are within normal limits.  A full examination was performed including scalp, head, eyes, ears, nose, lips, neck, chest, axillae, abdomen, back, buttocks, bilateral upper extremities, bilateral lower extremities, hands, feet, fingers, toes, fingernails, and toenails. All findings within normal limits unless otherwise noted below.  Back x 3 (3) Erythematous keratotic or waxy stuck-on papule or plaque.    Assessment & Plan  Inflamed seborrheic keratosis Back x 3  Symptomatic, with itching.  Destruction of lesion - Back x 3 Complexity: simple   Destruction method: cryotherapy   Informed consent: discussed and consent obtained   Timeout:  patient name, date of birth, surgical site, and procedure verified Lesion destroyed using liquid nitrogen: Yes   Region frozen until ice ball extended beyond lesion: Yes   Outcome: patient tolerated procedure well with no complications   Post-procedure details: wound care instructions given    Skin cancer screening  Lentigines - Scattered tan macules - Due to sun exposure - Benign-appering, observe - Recommend daily broad spectrum sunscreen SPF 30+ to sun-exposed areas, reapply every 2 hours as needed. - Call for any changes  Seborrheic Keratoses - Stuck-on, waxy, tan-brown papules and/or plaques  -  Benign-appearing - Discussed benign etiology and prognosis. - Observe - Call for any changes  Melanocytic Nevi - Tan-brown and/or pink-flesh-colored symmetric macules and papules - Benign appearing on exam today - Observation - Call clinic for new or changing moles - Recommend daily use of broad spectrum spf 30+ sunscreen to sun-exposed areas.   Hemangiomas - Red papules - Discussed benign nature - Observe - Call for any changes  Actinic Damage - Chronic condition, secondary to cumulative UV/sun exposure - diffuse scaly erythematous macules with underlying dyspigmentation - Recommend daily broad spectrum sunscreen SPF 30+ to sun-exposed areas, reapply every 2 hours as needed.  - Staying in the shade or wearing long sleeves, sun glasses (UVA+UVB protection) and wide brim hats (4-inch brim around the entire circumference of the hat) are also recommended for sun protection.  - Call for new or changing lesions.  Skin cancer screening performed today.  Return in about 1 year (around 11/08/2021) for TBSE - hx SCC .  Luther Redo, CMA, am acting as scribe for Sarina Ser, MD .  Documentation: I have reviewed the above documentation for accuracy and completeness, and I agree with the above.  Sarina Ser, MD

## 2020-11-10 ENCOUNTER — Encounter: Payer: Self-pay | Admitting: Dermatology

## 2021-11-09 ENCOUNTER — Ambulatory Visit (INDEPENDENT_AMBULATORY_CARE_PROVIDER_SITE_OTHER): Payer: Medicare Other | Admitting: Dermatology

## 2021-11-09 ENCOUNTER — Encounter: Payer: Self-pay | Admitting: Dermatology

## 2021-11-09 DIAGNOSIS — Z1283 Encounter for screening for malignant neoplasm of skin: Secondary | ICD-10-CM | POA: Diagnosis not present

## 2021-11-09 DIAGNOSIS — Z85828 Personal history of other malignant neoplasm of skin: Secondary | ICD-10-CM | POA: Diagnosis not present

## 2021-11-09 DIAGNOSIS — L814 Other melanin hyperpigmentation: Secondary | ICD-10-CM

## 2021-11-09 DIAGNOSIS — L821 Other seborrheic keratosis: Secondary | ICD-10-CM

## 2021-11-09 DIAGNOSIS — D1800 Hemangioma unspecified site: Secondary | ICD-10-CM

## 2021-11-09 DIAGNOSIS — L578 Other skin changes due to chronic exposure to nonionizing radiation: Secondary | ICD-10-CM

## 2021-11-09 DIAGNOSIS — Z872 Personal history of diseases of the skin and subcutaneous tissue: Secondary | ICD-10-CM

## 2021-11-09 DIAGNOSIS — D229 Melanocytic nevi, unspecified: Secondary | ICD-10-CM

## 2021-11-09 NOTE — Progress Notes (Signed)
Follow-Up Visit   Subjective  Amanda Meyer is a 88 y.o. female who presents for the following: Total body skin exam (Hx of SCC Mid to distal R lower leg, hx of AKs). The patient presents for Total-Body Skin Exam (TBSE) for skin cancer screening and mole check.  The patient has spots, moles and lesions to be evaluated, some may be new or changing and the patient has concerns that these could be cancer.   The following portions of the chart were reviewed this encounter and updated as appropriate:   Tobacco  Allergies  Meds  Problems  Med Hx  Surg Hx  Fam Hx     Review of Systems:  No other skin or systemic complaints except as noted in HPI or Assessment and Plan.  Objective  Well appearing patient in no apparent distress; mood and affect are within normal limits.  A full examination was performed including scalp, head, eyes, ears, nose, lips, neck, chest, axillae, abdomen, back, buttocks, bilateral upper extremities, bilateral lower extremities, hands, feet, fingers, toes, fingernails, and toenails. All findings within normal limits unless otherwise noted below.  Mid to distal R lower leg Well healed scar with no evidence of recurrence, no lymphadenopathy.    Assessment & Plan  History of SCC (squamous cell carcinoma) of skin Mid to distal R lower leg Clear. Observe for recurrence. Call clinic for new or changing lesions.  Recommend regular skin exams, daily broad-spectrum spf 30+ sunscreen use, and photoprotection.    Skin cancer screening  Lentigines - Scattered tan macules - Due to sun exposure - Benign-appearing, observe - Recommend daily broad spectrum sunscreen SPF 30+ to sun-exposed areas, reapply every 2 hours as needed. - Call for any changes - back  Seborrheic Keratoses - Stuck-on, waxy, tan-brown papules and/or plaques  - Benign-appearing - Discussed benign etiology and prognosis. - Observe - Call for any changes - trunk, arms  Melanocytic Nevi -  Tan-brown and/or pink-flesh-colored symmetric macules and papules - Benign appearing on exam today - Observation - Call clinic for new or changing moles - Recommend daily use of broad spectrum spf 30+ sunscreen to sun-exposed areas.  - trunk  Hemangiomas - Red papules - Discussed benign nature - Observe - Call for any changes - trunk  Actinic Damage - Chronic condition, secondary to cumulative UV/sun exposure - diffuse scaly erythematous macules with underlying dyspigmentation - Recommend daily broad spectrum sunscreen SPF 30+ to sun-exposed areas, reapply every 2 hours as needed.  - Staying in the shade or wearing long sleeves, sun glasses (UVA+UVB protection) and wide brim hats (4-inch brim around the entire circumference of the hat) are also recommended for sun protection.  - Call for new or changing lesions.  Skin cancer screening performed today.  History of PreCancerous Actinic Keratosis  - site(s) of PreCancerous Actinic Keratosis clear today. - these may recur and new lesions may form requiring treatment to prevent transformation into skin cancer - observe for new or changing spots and contact [facility] for appointment if occur - photoprotection with sun protective clothing; sunglasses and broad spectrum sunscreen with SPF of at least 30 + and frequent self skin exams recommended - yearly exams by a dermatologist recommended for persons with history of PreCancerous Actinic Keratoses    Return in about 1 year (around 11/10/2022) for TBSE, Hx of BCC.  I, Othelia Pulling, RMA, am acting as scribe for Sarina Ser, MD . Documentation: I have reviewed the above documentation for accuracy and completeness, and  I agree with the above.  Sarina Ser, MD

## 2021-11-09 NOTE — Patient Instructions (Signed)
Due to recent changes in healthcare laws, you may see results of your pathology and/or laboratory studies on MyChart before the doctors have had a chance to review them. We understand that in some cases there may be results that are confusing or concerning to you. Please understand that not all results are received at the same time and often the doctors may need to interpret multiple results in order to provide you with the best plan of care or course of treatment. Therefore, we ask that you please give us 2 business days to thoroughly review all your results before contacting the office for clarification. Should we see a critical lab result, you will be contacted sooner.   If You Need Anything After Your Visit  If you have any questions or concerns for your doctor, please call our main line at [phone] and press option 4 to reach your doctor's medical assistant. If no one answers, please leave a voicemail as directed and we will return your call as soon as possible. Messages left after 4 pm will be answered the following business day.   You may also send us a message via MyChart. We typically respond to MyChart messages within 1-2 business days.  For prescription refills, please ask your pharmacy to contact our office. Our fax number is [phone].  If you have an urgent issue when the clinic is closed that cannot wait until the next business day, you can page your doctor at the number below.    Please note that while we do our best to be available for urgent issues outside of office hours, we are not available 24/7.   If you have an urgent issue and are unable to reach us, you may choose to seek medical care at your doctor's office, retail clinic, urgent care center, or emergency room.  If you have a medical emergency, please immediately call 911 or go to the emergency department.  Pager Numbers  - Dr. Kowalski: [phone]  - Dr. Moye: [phone]  - Dr. Stewart:  [phone]  In the event of inclement weather, please call our main line at [phone] for an update on the status of any delays or closures.  Dermatology Medication Tips: Please keep the boxes that topical medications come in in order to help keep track of the instructions about where and how to use these. Pharmacies typically print the medication instructions only on the boxes and not directly on the medication tubes.   If your medication is too expensive, please contact our office at [phone] option 4 or send us a message through MyChart.   We are unable to tell what your co-pay for medications will be in advance as this is different depending on your insurance coverage. However, we may be able to find a substitute medication at lower cost or fill out paperwork to get insurance to cover a needed medication.   If a prior authorization is required to get your medication covered by your insurance company, please allow us 1-2 business days to complete this process.  Drug prices often vary depending on where the prescription is filled and some pharmacies may offer cheaper prices.  The website www.goodrx.com contains coupons for medications through different pharmacies. The prices here do not account for what the cost may be with help from insurance (it may be cheaper with your insurance), but the website can give you the price if you did not use any insurance.  - You can print the associated coupon and take it with   your prescription to the pharmacy.  - You may also stop by our office during regular business hours and pick up a GoodRx coupon card.  - If you need your prescription sent electronically to a different pharmacy, notify our office through [facility] MyChart or by phone at [phone] option 4.     Si Usted Necesita Algo Despus de Su Visita  Tambin puede enviarnos un mensaje a travs de MyChart. Por lo general respondemos a los mensajes de MyChart en el transcurso de 1 a 2  das hbiles.  Para renovar recetas, por favor pida a su farmacia que se ponga en contacto con nuestra oficina. Nuestro nmero de fax es el [phone].  Si tiene un asunto urgente cuando la clnica est cerrada y que no puede esperar hasta el siguiente da hbil, puede llamar/localizar a su doctor(a) al nmero que aparece a continuacin.   Por favor, tenga en cuenta que aunque hacemos todo lo posible para estar disponibles para asuntos urgentes fuera del horario de oficina, no estamos disponibles las 24 horas del da, los 7 das de la semana.   Si tiene un problema urgente y no puede comunicarse con nosotros, puede optar por buscar atencin mdica  en el consultorio de su doctor(a), en una clnica privada, en un centro de atencin urgente o en una sala de emergencias.  Si tiene una emergencia mdica, por favor llame inmediatamente al 911 o vaya a la sala de emergencias.  Nmeros de bper  - Dr. Kowalski: [phone]  - Dra. Moye: [phone]  - Dra. Stewart: [phone]  En caso de inclemencias del tiempo, por favor llame a nuestra lnea principal al [phone] para una actualizacin sobre el estado de cualquier retraso o cierre.  Consejos para la medicacin en dermatologa: Por favor, guarde las cajas en las que vienen los medicamentos de uso tpico para ayudarle a seguir las instrucciones sobre dnde y cmo usarlos. Las farmacias generalmente imprimen las instrucciones del medicamento slo en las cajas y no directamente en los tubos del medicamento.   Si su medicamento es muy caro, por favor, pngase en contacto con nuestra oficina llamando al [phone] y presione la opcin 4 o envenos un mensaje a travs de MyChart.   No podemos decirle cul ser su copago por los medicamentos por adelantado ya que esto es diferente dependiendo de la cobertura de su seguro. Sin embargo, es posible que podamos encontrar un medicamento sustituto a menor costo o llenar un formulario para que el  seguro cubra el medicamento que se considera necesario.   Si se requiere una autorizacin previa para que su compaa de seguros cubra su medicamento, por favor permtanos de 1 a 2 das hbiles para completar este proceso.  Los precios de los medicamentos varan con frecuencia dependiendo del lugar de dnde se surte la receta y alguna farmacias pueden ofrecer precios ms baratos.  El sitio web www.goodrx.com tiene cupones para medicamentos de diferentes farmacias. Los precios aqu no tienen en cuenta lo que podra costar con la ayuda del seguro (puede ser ms barato con su seguro), pero el sitio web puede darle el precio si no utiliz ningn seguro.  - Puede imprimir el cupn correspondiente y llevarlo con su receta a la farmacia.  - Tambin puede pasar por nuestra oficina durante el horario de atencin regular y recoger una tarjeta de cupones de GoodRx.  - Si necesita que su receta se enve electrnicamente a una farmacia diferente, informe a nuestra oficina a travs de MyChart de [place]   o por telfono llamando al [phone] y presione la opcin 4.  

## 2021-11-10 ENCOUNTER — Encounter: Payer: Self-pay | Admitting: Dermatology

## 2022-02-16 ENCOUNTER — Other Ambulatory Visit: Payer: Self-pay | Admitting: Otolaryngology

## 2022-02-16 DIAGNOSIS — H9312 Tinnitus, left ear: Secondary | ICD-10-CM

## 2022-03-09 ENCOUNTER — Ambulatory Visit
Admission: RE | Admit: 2022-03-09 | Discharge: 2022-03-09 | Disposition: A | Discharge status: Home / Self Care | Payer: Medicare Other | Source: Ambulatory Visit | Attending: Otolaryngology | Admitting: Otolaryngology

## 2022-03-09 DIAGNOSIS — H9312 Tinnitus, left ear: Secondary | ICD-10-CM

## 2022-03-09 MED ORDER — GADOBENATE DIMEGLUMINE 529 MG/ML IV SOLN
13.0000 mL | Freq: Once | INTRAVENOUS | Status: AC | PRN
  Administered 2022-03-09: 13 mL via INTRAVENOUS

## 2022-11-15 ENCOUNTER — Ambulatory Visit (INDEPENDENT_AMBULATORY_CARE_PROVIDER_SITE_OTHER): Payer: Medicare Other | Admitting: Dermatology

## 2022-11-15 VITALS — BP 136/81 | HR 77

## 2022-11-15 DIAGNOSIS — Z1283 Encounter for screening for malignant neoplasm of skin: Secondary | ICD-10-CM

## 2022-11-15 DIAGNOSIS — L299 Pruritus, unspecified: Secondary | ICD-10-CM

## 2022-11-15 DIAGNOSIS — Z7189 Other specified counseling: Secondary | ICD-10-CM

## 2022-11-15 DIAGNOSIS — W908XXA Exposure to other nonionizing radiation, initial encounter: Secondary | ICD-10-CM

## 2022-11-15 DIAGNOSIS — L814 Other melanin hyperpigmentation: Secondary | ICD-10-CM

## 2022-11-15 DIAGNOSIS — L821 Other seborrheic keratosis: Secondary | ICD-10-CM

## 2022-11-15 DIAGNOSIS — L578 Other skin changes due to chronic exposure to nonionizing radiation: Secondary | ICD-10-CM

## 2022-11-15 DIAGNOSIS — R202 Paresthesia of skin: Secondary | ICD-10-CM

## 2022-11-15 DIAGNOSIS — D1801 Hemangioma of skin and subcutaneous tissue: Secondary | ICD-10-CM

## 2022-11-15 DIAGNOSIS — Z8589 Personal history of malignant neoplasm of other organs and systems: Secondary | ICD-10-CM

## 2022-11-15 DIAGNOSIS — D229 Melanocytic nevi, unspecified: Secondary | ICD-10-CM

## 2022-11-15 DIAGNOSIS — Z85828 Personal history of other malignant neoplasm of skin: Secondary | ICD-10-CM

## 2022-11-15 NOTE — Progress Notes (Signed)
Follow-Up Visit   Subjective  Amanda Meyer is a 89 y.o. female who presents for the following: Skin Cancer Screening and Full Body Skin Exam, hx of SCC, patient c/o itchy area on her back with no rash.  The patient presents for Total-Body Skin Exam (TBSE) for skin cancer screening and mole check. The patient has spots, moles and lesions to be evaluated, some may be new or changing and the patient has concerns that these could be cancer.  The following portions of the chart were reviewed this encounter and updated as appropriate: medications, allergies, medical history  Review of Systems:  No other skin or systemic complaints except as noted in HPI or Assessment and Plan.  Objective  Well appearing patient in no apparent distress; mood and affect are within normal limits.  A full examination was performed including scalp, head, eyes, ears, nose, lips, neck, chest, axillae, abdomen, back, buttocks, bilateral upper extremities, bilateral lower extremities, hands, feet, fingers, toes, fingernails, and toenails. All findings within normal limits unless otherwise noted below.   Relevant physical exam findings are noted in the Assessment and Plan.   Assessment & Plan   LENTIGINES, SEBORRHEIC KERATOSES, HEMANGIOMAS - Benign normal skin lesions - Benign-appearing - Call for any changes  MELANOCYTIC NEVI - Tan-brown and/or pink-flesh-colored symmetric macules and papules - Benign appearing on exam today - Observation - Call clinic for new or changing moles - Recommend daily use of broad spectrum spf 30+ sunscreen to sun-exposed areas.   ACTINIC DAMAGE - Chronic condition, secondary to cumulative UV/sun exposure - diffuse scaly erythematous macules with underlying dyspigmentation - Recommend daily broad spectrum sunscreen SPF 30+ to sun-exposed areas, reapply every 2 hours as needed.  - Staying in the shade or wearing long sleeves, sun glasses (UVA+UVB protection) and wide brim hats  (4-inch brim around the entire circumference of the hat) are also recommended for sun protection.  - Call for new or changing lesions.  Pruritus secondary to Seborrheic keratosis  vs Notalgia paresthetica  Back  Exam: brown macules and papules Discussed treatment option LN2 or topical cream  Patient decline LN2  Try over the counter Cerave cream if no better try Amlactin rapid relief lotion for itchy areas on her back NOTALGIA PARESTHETICA Exam: Perispinal hyperpigmented patch Chronic condition without cure secondary to pinched nerve along spine causing itching or sensation changes in an area of skin. Chronic rubbing or scratching causes darkening of the skin.  OTC treatments which can help with itch include numbing creams like pramoxine or lidocaine which temporarily reduce itch or Capsaicin-containing creams which cause a burning sensation but which sometimes over time will reset the nerves to stop producing itch.  If you choose to use Capsaicin cream, it is recommended to use it 5 times daily for 1 week followed by 3 times daily for 3-6 weeks. You may have to continue using it long-term.  If not doing well with OTC options, could consider [facility] compounded prescription anti-itch cream with Amitriptyline 5% / Lidocaine 5% / Pramoxine 1% or Amitriptyline 5% / Gabapentin 10% / Lidocaine 5% Cream or other prescription cream or pill options.   HISTORY OF SQUAMOUS CELL CARCINOMA OF THE SKIN - No evidence of recurrence today - No lymphadenopathy - Recommend regular full body skin exams - Recommend daily broad spectrum sunscreen SPF 30+ to sun-exposed areas, reapply every 2 hours as needed.  - Call if any new or changing lesions are noted between office visits  SKIN CANCER SCREENING PERFORMED TODAY.  Return in about 1 year (around 11/15/2023) for TBSE, hx of SCC.  IAngelique Holm, CMA, am acting as scribe for Armida Sans, MD .   Documentation: I have reviewed the above  documentation for accuracy and completeness, and I agree with the above.  Armida Sans, MD

## 2022-11-15 NOTE — Patient Instructions (Addendum)
Try over the counter Cerave cream or Amlactin rapid relief lotion for itchy areas on her back    Due to recent changes in healthcare laws, you may see results of your pathology and/or laboratory studies on MyChart before the doctors have had a chance to review them. We understand that in some cases there may be results that are confusing or concerning to you. Please understand that not all results are received at the same time and often the doctors may need to interpret multiple results in order to provide you with the best plan of care or course of treatment. Therefore, we ask that you please give [place] 2 business days to thoroughly review all your results before contacting the office for clarification. Should we see a critical lab result, you will be contacted sooner.   If You Need Anything After Your Visit  If you have any questions or concerns for your doctor, please call our main line at [phone] and press option 4 to reach your doctor's medical assistant. If no one answers, please leave a voicemail as directed and we will return your call as soon as possible. Messages left after 4 pm will be answered the following business day.   You may also send [place] a message via MyChart. We typically respond to MyChart messages within 1-2 business days.  For prescription refills, please ask your pharmacy to contact our office. Our fax number is [phone].  If you have an urgent issue when the clinic is closed that cannot wait until the next business day, you can page your doctor at the number below.    Please note that while we do our best to be available for urgent issues outside of office hours, we are not available 24/7.   If you have an urgent issue and are unable to reach [place], you may choose to seek medical care at your doctor's office, retail clinic, urgent care center, or emergency room.  If you have a medical emergency, please immediately call 911 or go to the emergency department.  Pager  Numbers  - Dr. Gwen Pounds: [phone]  - Dr. Neale Burly: [phone]  - Dr. Roseanne Reno: [phone]  In the event of inclement weather, please call our main line at [phone] for an update on the status of any delays or closures.  Dermatology Medication Tips: Please keep the boxes that topical medications come in in order to help keep track of the instructions about where and how to use these. Pharmacies typically print the medication instructions only on the boxes and not directly on the medication tubes.   If your medication is too expensive, please contact our office at [phone] option 4 or send [place] a message through MyChart.   We are unable to tell what your co-pay for medications will be in advance as this is different depending on your insurance coverage. However, we may be able to find a substitute medication at lower cost or fill out paperwork to get insurance to cover a needed medication.   If a prior authorization is required to get your medication covered by your insurance company, please allow [place] 1-2 business days to complete this process.  Drug prices often vary depending on where the prescription is filled and some pharmacies may offer cheaper prices.  The website www.goodrx.com contains coupons for medications through different pharmacies. The prices here do not account for what the cost may be with help from insurance (it may be cheaper with your insurance), but the website can give you the  price if you did not use any insurance.  - You can print the associated coupon and take it with your prescription to the pharmacy.  - You may also stop by our office during regular business hours and pick up a GoodRx coupon card.  - If you need your prescription sent electronically to a different pharmacy, notify our office through [facility] or by phone at [phone] option 4.     Si Usted Necesita Algo Despus de Su Visita  Tambin puede enviarnos un mensaje a travs de  Pharmacist, community. Por lo general respondemos a los mensajes de MyChart en el transcurso de 1 a 2 das hbiles.  Para renovar recetas, por favor pida a su farmacia que se ponga en contacto con nuestra oficina. Harland Dingwall de fax es [place] [phone].  Si tiene un asunto urgente cuando la clnica est cerrada y que no puede esperar hasta el siguiente da hbil, puede llamar/localizar a su doctor(a) al nmero que aparece a continuacin.   Por favor, tenga en cuenta que aunque hacemos todo lo posible para estar disponibles para asuntos urgentes fuera del horario de [place], no estamos disponibles las 24 horas del da, los 7 das de la [place].   Si tiene un problema urgente y no puede comunicarse con nosotros, puede optar por buscar atencin mdica  en el consultorio de su doctor(a), en una clnica privada, en un centro de atencin urgente o en una sala de emergencias.  Si tiene Engineering geologist, por favor llame inmediatamente al 911 o vaya a la sala de emergencias.  Nmeros de bper  - Dr. Nehemiah Massed: [phone]  - Dra. Moye: [phone]  - Dra. Nicole Kindred: [phone]  En caso de inclemencias del [place], por favor llame a Johnsie Kindred principal al [phone] para una actualizacin sobre el [place] de cualquier retraso o cierre.  Consejos para la medicacin en dermatologa: Por favor, guarde las cajas en las que vienen los medicamentos de uso tpico para ayudarle a seguir las instrucciones sobre dnde y cmo usarlos. Las farmacias generalmente imprimen las instrucciones del medicamento slo en las cajas y no directamente en los tubos del [place].   Si su medicamento es muy caro, por favor, pngase en contacto con Zigmund Daniel llamando al [phone] y presione la opcin 4 o envenos un mensaje a travs de Pharmacist, community.   No podemos decirle cul ser su copago por los medicamentos por adelantado ya que esto es diferente dependiendo de la cobertura de su seguro. Sin embargo, es posible que  podamos encontrar un medicamento sustituto a Electrical engineer un formulario para que el seguro cubra el medicamento que se considera necesario.   Si se requiere una autorizacin previa para que su compaa de seguros [place] su medicamento, por favor permtanos de 1 a 2 das hbiles para completar este proceso.  Los precios de los medicamentos varan con frecuencia dependiendo del Environmental consultant de dnde se surte la receta y alguna farmacias pueden ofrecer precios ms baratos.  El sitio web www.goodrx.com tiene cupones para medicamentos de Airline pilot. Los precios aqu no tienen en cuenta lo que podra costar con la ayuda del seguro (puede ser ms barato con su seguro), pero el sitio web puede darle el precio si no utiliz Research scientist (physical sciences).  - Puede imprimir el cupn correspondiente y llevarlo con su receta a la farmacia.  - Tambin puede pasar por nuestra oficina durante el horario de atencin regular y Charity fundraiser una tarjeta de cupones de GoodRx.  - Si necesita que  su receta se enve electrnicamente a una farmacia diferente, informe a nuestra oficina a travs de MyChart de [place] o por telfono llamando al [phone] y presione la opcin 4.  

## 2022-11-17 ENCOUNTER — Encounter: Payer: Self-pay | Admitting: Dermatology

## 2023-11-27 ENCOUNTER — Ambulatory Visit: Payer: Medicare Other | Admitting: Dermatology

## 2023-12-05 ENCOUNTER — Ambulatory Visit: Payer: Medicare Other | Admitting: Dermatology

## 2023-12-05 DIAGNOSIS — D692 Other nonthrombocytopenic purpura: Secondary | ICD-10-CM

## 2023-12-05 DIAGNOSIS — L578 Other skin changes due to chronic exposure to nonionizing radiation: Secondary | ICD-10-CM | POA: Diagnosis not present

## 2023-12-05 DIAGNOSIS — L820 Inflamed seborrheic keratosis: Secondary | ICD-10-CM | POA: Diagnosis not present

## 2023-12-05 DIAGNOSIS — Z1283 Encounter for screening for malignant neoplasm of skin: Secondary | ICD-10-CM

## 2023-12-05 DIAGNOSIS — W908XXA Exposure to other nonionizing radiation, initial encounter: Secondary | ICD-10-CM

## 2023-12-05 DIAGNOSIS — D1801 Hemangioma of skin and subcutaneous tissue: Secondary | ICD-10-CM

## 2023-12-05 DIAGNOSIS — L814 Other melanin hyperpigmentation: Secondary | ICD-10-CM | POA: Diagnosis not present

## 2023-12-05 DIAGNOSIS — L821 Other seborrheic keratosis: Secondary | ICD-10-CM

## 2023-12-05 DIAGNOSIS — Z872 Personal history of diseases of the skin and subcutaneous tissue: Secondary | ICD-10-CM

## 2023-12-05 DIAGNOSIS — D229 Melanocytic nevi, unspecified: Secondary | ICD-10-CM

## 2023-12-05 DIAGNOSIS — L570 Actinic keratosis: Secondary | ICD-10-CM

## 2023-12-05 DIAGNOSIS — Z8589 Personal history of malignant neoplasm of other organs and systems: Secondary | ICD-10-CM

## 2023-12-05 DIAGNOSIS — Z85828 Personal history of other malignant neoplasm of skin: Secondary | ICD-10-CM

## 2023-12-05 NOTE — Patient Instructions (Addendum)

## 2023-12-05 NOTE — Progress Notes (Unsigned)
 Follow-Up Visit   Subjective  Amanda Meyer is a 89 y.o. female who presents for the following: Skin Cancer Screening and Full Body Skin Exam, hx of SCC, hx of precancers.   The patient presents for Total-Body Skin Exam (TBSE) for skin cancer screening and mole check. The patient has spots, moles and lesions to be evaluated, some may be new or changing and the patient may have concern these could be cancer.  The following portions of the chart were reviewed this encounter and updated as appropriate: medications, allergies, medical history  Review of Systems:  No other skin or systemic complaints except as noted in HPI or Assessment and Plan.  Objective  Well appearing patient in no apparent distress; mood and affect are within normal limits.  A full examination was performed including scalp, head, eyes, ears, nose, lips, neck, chest, axillae, abdomen, back, buttocks, bilateral upper extremities, bilateral lower extremities, hands, feet, fingers, toes, fingernails, and toenails. All findings within normal limits unless otherwise noted below.   Relevant physical exam findings are noted in the Assessment and Plan.  nose x 2 (2) Erythematous thin papules/macules with gritty scale.  right post shoulder x 15, right cheek x 1 (16) Stuck-on, waxy, tan-brown papules and plaques -- Discussed benign etiology and prognosis.   Assessment & Plan   SKIN CANCER SCREENING PERFORMED TODAY.  ACTINIC DAMAGE - Chronic condition, secondary to cumulative UV/sun exposure - diffuse scaly erythematous macules with underlying dyspigmentation - Recommend daily broad spectrum sunscreen SPF 30+ to sun-exposed areas, reapply every 2 hours as needed.  - Staying in the shade or wearing long sleeves, sun glasses (UVA+UVB protection) and wide brim hats (4-inch brim around the entire circumference of the hat) are also recommended for sun protection.  - Call for new or changing lesions.  LENTIGINES, SEBORRHEIC  KERATOSES, HEMANGIOMAS - Benign normal skin lesions - Benign-appearing - Call for any changes  MELANOCYTIC NEVI - Tan-brown and/or pink-flesh-colored symmetric macules and papules - Benign appearing on exam today - Observation - Call clinic for new or changing moles - Recommend daily use of broad spectrum spf 30+ sunscreen to sun-exposed areas.   Purpura - Chronic; persistent and recurrent.  Treatable, but not curable. 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  - Call if any new or changing lesions are noted between office visits  AK (ACTINIC KERATOSIS) (2) nose x 2 (2) ACTINIC DAMAGE - chronic, secondary to cumulative UV radiation exposure/sun exposure over time - diffuse scaly erythematous macules with underlying dyspigmentation - Recommend daily broad spectrum sunscreen SPF 30+ to sun-exposed areas, reapply every 2 hours as needed.  - Recommend staying in the shade or wearing long sleeves, sun glasses (UVA+UVB protection) and wide brim hats (4-inch brim around the entire circumference of the hat). - Call for new or changing lesions.  Destruction of lesion - nose x 2 (2) Complexity: simple   Destruction method: cryotherapy   Informed consent: discussed and consent obtained  Timeout:  patient name, date of birth, surgical site, and procedure verified Lesion destroyed using liquid nitrogen: Yes   Region frozen until ice ball extended beyond lesion: Yes    Outcome: patient tolerated procedure well with no complications   Post-procedure details: wound care instructions given    INFLAMED SEBORRHEIC KERATOSIS (16) right post shoulder x 15, right cheek x 1 (16) Symptomatic, irritating, patient would like treated.  Destruction of lesion - right post shoulder x 15, right cheek x 1 (16) Complexity: simple   Destruction method: cryotherapy   Informed consent: discussed and consent obtained   Timeout:  patient name, date of birth, surgical site, and procedure verified Lesion destroyed using liquid nitrogen: Yes   Region frozen until ice ball extended beyond lesion: Yes   Outcome: patient tolerated procedure well with no complications   Post-procedure details: wound care instructions given      Return in about 1 year (around 12/04/2024) for TBSE, hx of SCC.  IFay Kirks, CMA, am acting as scribe for Alm Rhyme, MD .   Documentation: I have reviewed the above documentation for accuracy and completeness, and I agree with the above.  Alm Rhyme, MD

## 2023-12-06 ENCOUNTER — Encounter: Payer: Self-pay | Admitting: Dermatology

## 2024-12-10 ENCOUNTER — Ambulatory Visit: Admitting: Dermatology
